# Patient Record
Sex: Female | Born: 1980 | Race: White | Hispanic: No | Marital: Married | Smoking: Never smoker
Health system: Southern US, Community
[De-identification: ages and names within clinical notes are randomized; demographics above are authoritative.]

## PROBLEM LIST (undated history)

## (undated) ENCOUNTER — Inpatient Hospital Stay (HOSPITAL_COMMUNITY): Payer: Self-pay

## (undated) DIAGNOSIS — I10 Essential (primary) hypertension: Secondary | ICD-10-CM

---
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
## 2000-09-05 ENCOUNTER — Other Ambulatory Visit: Admission: RE | Admit: 2000-09-05 | Discharge: 2000-09-05 | Payer: Self-pay | Admitting: *Deleted

## 2001-06-22 ENCOUNTER — Other Ambulatory Visit: Admission: RE | Admit: 2001-06-22 | Discharge: 2001-06-22 | Payer: Self-pay | Admitting: *Deleted

## 2001-09-06 ENCOUNTER — Other Ambulatory Visit: Admission: RE | Admit: 2001-09-06 | Discharge: 2001-09-06 | Payer: Self-pay | Admitting: *Deleted

## 2001-11-23 ENCOUNTER — Emergency Department (HOSPITAL_COMMUNITY): Admission: EM | Admit: 2001-11-23 | Discharge: 2001-11-24 | Payer: Self-pay | Admitting: Emergency Medicine

## 2011-03-11 ENCOUNTER — Other Ambulatory Visit: Payer: Self-pay | Admitting: Family Medicine

## 2011-03-11 DIAGNOSIS — N6459 Other signs and symptoms in breast: Secondary | ICD-10-CM

## 2011-03-16 ENCOUNTER — Ambulatory Visit
Admission: RE | Admit: 2011-03-16 | Discharge: 2011-03-16 | Disposition: A | Discharge status: Home / Self Care | Payer: Self-pay | Source: Ambulatory Visit | Attending: Family Medicine | Admitting: Family Medicine

## 2011-03-16 DIAGNOSIS — N6459 Other signs and symptoms in breast: Secondary | ICD-10-CM

## 2015-08-10 ENCOUNTER — Other Ambulatory Visit (HOSPITAL_COMMUNITY): Payer: Self-pay | Admitting: Obstetrics & Gynecology

## 2015-08-10 DIAGNOSIS — O30032 Twin pregnancy, monochorionic/diamniotic, second trimester: Secondary | ICD-10-CM

## 2015-08-10 DIAGNOSIS — O09522 Supervision of elderly multigravida, second trimester: Secondary | ICD-10-CM

## 2015-08-10 DIAGNOSIS — Z3A16 16 weeks gestation of pregnancy: Secondary | ICD-10-CM

## 2015-08-10 DIAGNOSIS — O34219 Maternal care for unspecified type scar from previous cesarean delivery: Secondary | ICD-10-CM

## 2015-08-10 DIAGNOSIS — O139 Gestational [pregnancy-induced] hypertension without significant proteinuria, unspecified trimester: Secondary | ICD-10-CM

## 2015-08-10 DIAGNOSIS — IMO0001 Reserved for inherently not codable concepts without codable children: Secondary | ICD-10-CM

## 2015-08-24 ENCOUNTER — Other Ambulatory Visit (HOSPITAL_COMMUNITY): Payer: Self-pay | Admitting: Obstetrics & Gynecology

## 2015-08-24 ENCOUNTER — Ambulatory Visit (HOSPITAL_COMMUNITY)
Admission: RE | Admit: 2015-08-24 | Discharge: 2015-08-24 | Disposition: A | Discharge status: Home / Self Care | Payer: BLUE CROSS/BLUE SHIELD | Source: Ambulatory Visit | Attending: Obstetrics & Gynecology | Admitting: Obstetrics & Gynecology

## 2015-08-24 ENCOUNTER — Encounter (HOSPITAL_COMMUNITY): Payer: Self-pay | Admitting: Obstetrics & Gynecology

## 2015-08-24 ENCOUNTER — Encounter (HOSPITAL_COMMUNITY): Payer: Self-pay

## 2015-08-24 DIAGNOSIS — Z3A16 16 weeks gestation of pregnancy: Secondary | ICD-10-CM | POA: Insufficient documentation

## 2015-08-24 DIAGNOSIS — O30032 Twin pregnancy, monochorionic/diamniotic, second trimester: Secondary | ICD-10-CM

## 2015-09-08 ENCOUNTER — Encounter (HOSPITAL_COMMUNITY): Payer: Self-pay

## 2015-09-08 ENCOUNTER — Ambulatory Visit (HOSPITAL_COMMUNITY)
Admission: RE | Admit: 2015-09-08 | Discharge: 2015-09-08 | Disposition: A | Discharge status: Home / Self Care | Payer: BLUE CROSS/BLUE SHIELD | Source: Ambulatory Visit | Attending: Obstetrics & Gynecology | Admitting: Obstetrics & Gynecology

## 2015-09-08 DIAGNOSIS — O30032 Twin pregnancy, monochorionic/diamniotic, second trimester: Secondary | ICD-10-CM | POA: Diagnosis present

## 2015-09-15 ENCOUNTER — Other Ambulatory Visit (HOSPITAL_COMMUNITY): Payer: Self-pay | Admitting: Obstetrics & Gynecology

## 2015-09-15 DIAGNOSIS — Z3A27 27 weeks gestation of pregnancy: Secondary | ICD-10-CM

## 2015-09-15 DIAGNOSIS — O30032 Twin pregnancy, monochorionic/diamniotic, second trimester: Secondary | ICD-10-CM

## 2015-09-15 DIAGNOSIS — Z3A25 25 weeks gestation of pregnancy: Secondary | ICD-10-CM

## 2015-09-15 DIAGNOSIS — Z3A33 33 weeks gestation of pregnancy: Secondary | ICD-10-CM

## 2015-09-15 DIAGNOSIS — Z3A31 31 weeks gestation of pregnancy: Secondary | ICD-10-CM

## 2015-09-15 DIAGNOSIS — Z3A29 29 weeks gestation of pregnancy: Secondary | ICD-10-CM

## 2015-09-15 DIAGNOSIS — Z3A35 35 weeks gestation of pregnancy: Secondary | ICD-10-CM

## 2015-09-18 ENCOUNTER — Ambulatory Visit (INDEPENDENT_AMBULATORY_CARE_PROVIDER_SITE_OTHER): Payer: BLUE CROSS/BLUE SHIELD | Admitting: Cardiology

## 2015-09-18 ENCOUNTER — Ambulatory Visit (HOSPITAL_COMMUNITY)
Admission: RE | Admit: 2015-09-18 | Discharge: 2015-09-18 | Disposition: A | Discharge status: Home / Self Care | Payer: BLUE CROSS/BLUE SHIELD | Source: Ambulatory Visit | Attending: Obstetrics & Gynecology | Admitting: Obstetrics & Gynecology

## 2015-09-18 ENCOUNTER — Encounter (HOSPITAL_COMMUNITY): Payer: Self-pay

## 2015-09-18 ENCOUNTER — Encounter: Payer: Self-pay | Admitting: Cardiology

## 2015-09-18 VITALS — BP 110/68 | HR 105 | Ht 68.0 in | Wt 215.0 lb

## 2015-09-18 DIAGNOSIS — R000 Tachycardia, unspecified: Secondary | ICD-10-CM | POA: Diagnosis not present

## 2015-09-18 DIAGNOSIS — R0600 Dyspnea, unspecified: Secondary | ICD-10-CM

## 2015-09-18 DIAGNOSIS — R002 Palpitations: Secondary | ICD-10-CM | POA: Diagnosis not present

## 2015-09-18 DIAGNOSIS — R0609 Other forms of dyspnea: Secondary | ICD-10-CM

## 2015-09-18 DIAGNOSIS — I471 Supraventricular tachycardia: Secondary | ICD-10-CM

## 2015-09-18 DIAGNOSIS — O30032 Twin pregnancy, monochorionic/diamniotic, second trimester: Secondary | ICD-10-CM | POA: Diagnosis not present

## 2015-09-18 LAB — BASIC METABOLIC PANEL
BUN: 6 mg/dL — ABNORMAL LOW (ref 7–25)
CO2: 21 mmol/L (ref 20–31)
Calcium: 8.9 mg/dL (ref 8.6–10.2)
Chloride: 105 mmol/L (ref 98–110)
Creat: 0.48 mg/dL — ABNORMAL LOW (ref 0.50–1.10)
Glucose, Bld: 76 mg/dL (ref 65–99)
Potassium: 4.1 mmol/L (ref 3.5–5.3)
Sodium: 137 mmol/L (ref 135–146)

## 2015-09-18 LAB — TSH: TSH: 1.541 u[IU]/mL (ref 0.350–4.500)

## 2015-09-18 NOTE — Progress Notes (Signed)
Patient ID: Jessica Wilkins, female   DOB: [birth year], 34 y.o.   MRN: [id number]      Cardiology Office Note  Date:  09/19/2015   ID:  Jessica Wilkins, DOB [birth year], MRN [id number]  PCP:  No PCP Per Patient  Cardiologist:  Lars Masson, MD   History of Present Illness: Jessica Wilkins is a 34 y.o. female who presents for evaluation of tachycardia and palpitations during pregnancy, She is currently pregnant with twins (21. Weeks), due date in February 2017, she has one prior child. No problem during the first pregnancy. During this one she started to experience profound fatigue, when her HR is checked she is frequently tachycardic. On three occassions now she wakes up with tachycardia. She had those episodes prior to the pregnancy, but now they are worse.  Her labs show normal Hb, no TSH checked.  NO syncope, orthopnea, PND, no LE edema.   Past Medical History  Diagnosis Date  . Medical history non-contributory    Past Surgical History  Procedure Laterality Date  . Cesarean section      Current Outpatient Prescriptions  Medication Sig Dispense Refill  . aspirin 81 MG tablet Take 81 mg by mouth daily.    . calcium carbonate (TUMS - DOSED IN MG ELEMENTAL CALCIUM) 500 MG chewable tablet Chew 1 tablet by mouth daily.    . Prenatal Vit-Fe Fumarate-FA (PRENATAL VITAMIN PO) Take 1 tablet by mouth daily.     Marland Kitchen DICLEGIS 10-10 MG TBEC TK 2 TS PO HS  2   No current facility-administered medications for this visit.   Allergies:   Amoxicillin and Penicillins   Social History:  The patient  reports that she has never smoked. She does not have any smokeless tobacco history on file. She reports that she does not drink alcohol or use illicit drugs.   Family History:  The patient's family history includes Hypertension in her paternal grandmother; Stroke in her paternal grandmother. There is no history of Heart attack.   ROS:  Please see the history of present illness.  ll other  systems are reviewed and negative.   PHYSICAL EXAM: VS:  BP 110/68 mmHg  Pulse 105  Ht  (1.727 m)  Wt 215 lb (97.523 kg)  BMI 32.70 kg/m2 , BMI Body mass index is 32.7 kg/(m^2). GEN: Well nourished, well developed, in no acute distress, [redacted] weeks pregnant HEENT: normal Neck: no JVD, carotid bruits, or masses Cardiac: RRR; no murmurs, rubs, or gallops,no edema  Respiratory:  clear to auscultation bilaterally, normal work of breathing GI: soft, nontender, nondistended, + BS MS: no deformity or atrophy Skin: warm and dry, no rash Neuro:  Strength and sensation are intact Psych: euthymic mood, full affect  EKG:  Sinus tachycardia, otherwise normal ECG.   Recent Labs: 09/18/2015: BUN 6*; Creat 0.48*; Potassium 4.1; Sodium 137; TSH 1.541   Lipid Panel No results found for: CHOL, TRIG, HDL, CHOLHDL, VLDL, LDLCALC, LDLDIRECT   Wt Readings from Last 3 Encounters:  09/18/15 216 lb 9.6 oz (98.249 kg)  09/18/15 215 lb (97.523 kg)  09/08/15 211 lb 6.4 oz (95.89 kg)     ASSESSMENT AND PLAN:  1.  Palpitations, possible SVT - start 24 hour Holter monitor  2. Persistent sinus tachycardia - 24 hour Holter, if predominant part of the day, we will consider labetalol, while increase in HR is normal in pregnancy, persistent ST might leads to heart failure.  3. DOE, fatigue - we will check echocardiogram  Labs/ tests ordered today include: Orders Placed This Encounter  Procedures  . TSH  . Basic metabolic panel  . Holter monitor - 24 hour  . EKG 12-Lead  . ECHOCARDIOGRAM COMPLETE   Follow up in 6 weeks.   Signed, Lars Masson, MD  09/19/2015 7:01 AM    [facility] [address] [place], [place], [place]  [address] Phone: [phone]; Fax: [phone]

## 2015-09-18 NOTE — Patient Instructions (Addendum)
Medication Instructions:  Your physician recommends that you continue on your current medications as directed. Please refer to the Current Medication list given to you today.   Labwork: Your physician recommends that you return for lab work in:  TODAY  BMET  AND  TSH   Testing/Procedures: Your physician has requested that you have an echocardiogram. Echocardiography is a painless test that uses sound waves to create images of your heart. It provides your doctor with information about the size and shape of your heart and how well your heart's chambers and valves are working. This procedure takes approximately one hour. There are no restrictions for this procedure.   Your physician has recommended that you wear a holter monitor. Holter monitors are medical devices that record the heart's electrical activity. Doctors most often use these monitors to diagnose arrhythmias. Arrhythmias are problems with the speed or rhythm of the heartbeat. The monitor is a small, portable device. You can wear one while you do your normal daily activities. This is usually used to diagnose what is causing palpitations/syncope (passing out).  24 HOUR  Follow-Up: Your physician recommends that you schedule a follow-up appointment in: 6 WEEKS WITH DR Delton See  Any Other Special Instructions Will Be Listed Below (If Applicable).

## 2015-09-23 ENCOUNTER — Ambulatory Visit (HOSPITAL_COMMUNITY): Payer: BLUE CROSS/BLUE SHIELD

## 2015-09-28 ENCOUNTER — Ambulatory Visit (HOSPITAL_COMMUNITY): Payer: BLUE CROSS/BLUE SHIELD | Attending: Cardiovascular Disease

## 2015-09-28 ENCOUNTER — Ambulatory Visit (INDEPENDENT_AMBULATORY_CARE_PROVIDER_SITE_OTHER): Payer: BLUE CROSS/BLUE SHIELD

## 2015-09-28 ENCOUNTER — Other Ambulatory Visit: Payer: Self-pay

## 2015-09-28 DIAGNOSIS — R002 Palpitations: Secondary | ICD-10-CM | POA: Diagnosis not present

## 2015-09-28 DIAGNOSIS — Z3A22 22 weeks gestation of pregnancy: Secondary | ICD-10-CM | POA: Diagnosis not present

## 2015-09-30 ENCOUNTER — Ambulatory Visit (HOSPITAL_COMMUNITY): Payer: BLUE CROSS/BLUE SHIELD

## 2015-10-02 ENCOUNTER — Ambulatory Visit (HOSPITAL_COMMUNITY)
Admission: RE | Admit: 2015-10-02 | Discharge: 2015-10-02 | Disposition: A | Discharge status: Home / Self Care | Payer: BLUE CROSS/BLUE SHIELD | Source: Ambulatory Visit | Attending: Obstetrics & Gynecology | Admitting: Obstetrics & Gynecology

## 2015-10-02 ENCOUNTER — Other Ambulatory Visit: Payer: Self-pay | Admitting: *Deleted

## 2015-10-02 DIAGNOSIS — O30032 Twin pregnancy, monochorionic/diamniotic, second trimester: Secondary | ICD-10-CM | POA: Diagnosis not present

## 2015-10-02 MED ORDER — LABETALOL HCL 100 MG PO TABS: 100.0000 mg | ORAL_TABLET | Freq: Two times a day (BID) | ORAL | Status: DC

## 2015-10-07 ENCOUNTER — Telehealth: Payer: Self-pay | Admitting: Cardiology

## 2015-10-07 NOTE — Telephone Encounter (Signed)
Pt calling to report that since she has started taking her labetalol 100 mg bid she feels more tired and she had to leave work today, for she felt as if her BP was dropping.  Pt states she went home and took her BP and HR and recordings show BP- 90/57 HR-95. Pt states she took her morning dose of labetalol, but has not taken her evening dose yet.  Pt states she is at home resting, and is feeling somewhat better, but the labetalol is making her more fatigued.  Pt states she is stable enough to remain at home, and [facility]  Pt states that she does feel her babies kicking frequently, so she's not worried about them.  Advised the pt that given she's stable and doesn't want to [facility] at [facility], I advised her to hold her evening dose of labetalol, push extra PO fluids tonight, rest with her feet propped up on pillows, and call me back in the morning prior to taking her morning dose of labetalol, to report her current BP and HR.  Advised the pt to contact the on-call MD at her OBGYN and inform them of this plan and any other recommendations.  Informed the pt that Dr Delton SeeNelson is currently out of the office at this time, but will be in the hospital tomorrow.  Informed the pt that I will route this message to Dr Delton SeeNelson for further review and recommendation and follow-up with her thereafter.  Advised the pt that if she becomes acutely distressed over the night, she should call 911, or seek immediate medical attention.  Pt verbalized understanding and agrees with this plan.

## 2015-10-07 NOTE — Telephone Encounter (Signed)
Pt c/o medication issue:  1. Name of Medication: Labetalol 100mg   2. How are you currently taking this medication (dosage and times per day)? 1 tablet 2x's a day  3. Are you having a reaction (difficulty breathing--STAT)?No  4. What is your medication issue? Pt feeling really tired and couldn't go to work today. BP has dropped to 90/57

## 2015-10-07 NOTE — Telephone Encounter (Signed)
Notes Recorded by Lorayne BenderAnita E Harding, RN on 10/02/2015 at 3:44 PM Notified of holter monitor results. Will send Rx in for Labetalol 100 mg BID. Advised to get a BP cuff and monitor BP. Advised to call us if becomes light headed, dizzy or if BP very low. Dorothy Sparkvy Byrne Capek,LPN will call her in a week but advised to please call if develops any symptoms. Notes Recorded by Lars MassonKatarina H Nelson, MD on 10/02/2015 at 12:56 PM Please call her today:  I would suggest labetalol 100 mg po BID if she can tolerate it with BP.   Benisha Hadaway, please call her in 1 week to see how she feels. Notes Recorded by Lars MassonKatarina H Nelson, MD on 10/02/2015 at 10:27 AM I would suggest labetalol 100 mg po BID if she can tolerate it with BP. Please tart and call her in 1 week to see how see feels.

## 2015-10-08 MED ORDER — LABETALOL HCL 100 MG PO TABS: 100.0000 mg | ORAL_TABLET | Freq: Every day | ORAL | Status: DC

## 2015-10-08 NOTE — Telephone Encounter (Signed)
Follow Up ° °Pt returned call//  °

## 2015-10-08 NOTE — Telephone Encounter (Signed)
HR 95 is great and ok in pregnancy. Its ok to take labetalol only once daily as HR is generally lower at night.

## 2015-10-08 NOTE — Telephone Encounter (Signed)
Informed the pt that per Dr Delton SeeNelson, her HR is 95 and ok in pregnancy, and she recommends that the pt take labetalol 100 mg po daily, as her HR is generally lower at night.  Advised the pt to continue drinking plenty of fluids.  Advised the pt to take this at the same time every day.  Pt prefers to take this at night, for it makes her tired.  Informed her that would be fine.  Advised the pt to continue monitoring her HR and BP and report any concerns to our office.  Changed this in pts med list. Confirmed the pharmacy of choice with the pt.  Pt verbalized understanding and agrees with this plan.

## 2015-10-08 NOTE — Telephone Encounter (Signed)
Pt calling, as instructed to, to report her current BP and HR after recommendations for her to hold her evening dose of labetalol last night and call me in the morning to report current BP and HR before taking her morning dose of labetalol.  Her current BP and HR this morning are now 105/60 and HR- 95. Pt states she is feeling somewhat better, but still tired.  Pt states she did exactly as advised, and is drinking po fluids aggressively.  Pt states she does have an appt with her OBGYN today at 2 pm, for follow-up. Pt reports that she can feel both of her babies moving appropriately.  Pt has no other cardiac complaints at this time.  She states she feels better only taking her labetalol once a day. Advised the pt to hold her a.m dose of labetalol, for her BP is low normal and HR is stable.  Informed the pt that I will route this message to Dr Delton SeeNelson for further review and recommendation and follow-up with her shortly thereafter.  Advised her to continue drinking fluids.  Pt verbalized understanding and agrees with this plan.

## 2015-10-16 ENCOUNTER — Encounter (HOSPITAL_COMMUNITY): Payer: Self-pay

## 2015-10-16 ENCOUNTER — Ambulatory Visit (HOSPITAL_COMMUNITY)
Admission: RE | Admit: 2015-10-16 | Discharge: 2015-10-16 | Disposition: A | Discharge status: Home / Self Care | Payer: BLUE CROSS/BLUE SHIELD | Source: Ambulatory Visit | Attending: Maternal and Fetal Medicine | Admitting: Maternal and Fetal Medicine

## 2015-10-16 DIAGNOSIS — Z3A24 24 weeks gestation of pregnancy: Secondary | ICD-10-CM | POA: Insufficient documentation

## 2015-10-16 DIAGNOSIS — O09522 Supervision of elderly multigravida, second trimester: Secondary | ICD-10-CM | POA: Diagnosis not present

## 2015-10-16 DIAGNOSIS — O09299 Supervision of pregnancy with other poor reproductive or obstetric history, unspecified trimester: Secondary | ICD-10-CM | POA: Insufficient documentation

## 2015-10-16 DIAGNOSIS — O30032 Twin pregnancy, monochorionic/diamniotic, second trimester: Secondary | ICD-10-CM | POA: Insufficient documentation

## 2015-10-16 DIAGNOSIS — O34219 Maternal care for unspecified type scar from previous cesarean delivery: Secondary | ICD-10-CM | POA: Diagnosis not present

## 2015-10-21 ENCOUNTER — Encounter: Payer: Self-pay | Admitting: Cardiology

## 2015-10-30 ENCOUNTER — Ambulatory Visit (HOSPITAL_COMMUNITY)
Admission: RE | Admit: 2015-10-30 | Discharge: 2015-10-30 | Disposition: A | Discharge status: Home / Self Care | Payer: BLUE CROSS/BLUE SHIELD | Source: Ambulatory Visit | Attending: Obstetrics & Gynecology | Admitting: Obstetrics & Gynecology

## 2015-10-30 ENCOUNTER — Other Ambulatory Visit (HOSPITAL_COMMUNITY): Payer: Self-pay | Admitting: Obstetrics & Gynecology

## 2015-10-30 ENCOUNTER — Encounter (HOSPITAL_COMMUNITY): Payer: Self-pay

## 2015-10-30 DIAGNOSIS — O30032 Twin pregnancy, monochorionic/diamniotic, second trimester: Secondary | ICD-10-CM

## 2015-10-30 DIAGNOSIS — O34219 Maternal care for unspecified type scar from previous cesarean delivery: Secondary | ICD-10-CM | POA: Insufficient documentation

## 2015-10-30 DIAGNOSIS — Z3A25 25 weeks gestation of pregnancy: Secondary | ICD-10-CM

## 2015-10-30 DIAGNOSIS — Z3A35 35 weeks gestation of pregnancy: Secondary | ICD-10-CM

## 2015-10-30 DIAGNOSIS — Z3A26 26 weeks gestation of pregnancy: Secondary | ICD-10-CM

## 2015-10-30 DIAGNOSIS — O09899 Supervision of other high risk pregnancies, unspecified trimester: Secondary | ICD-10-CM

## 2015-10-30 DIAGNOSIS — O09292 Supervision of pregnancy with other poor reproductive or obstetric history, second trimester: Secondary | ICD-10-CM | POA: Insufficient documentation

## 2015-10-30 DIAGNOSIS — O09522 Supervision of elderly multigravida, second trimester: Secondary | ICD-10-CM | POA: Diagnosis not present

## 2015-11-12 ENCOUNTER — Ambulatory Visit (HOSPITAL_COMMUNITY)
Admission: RE | Admit: 2015-11-12 | Discharge: 2015-11-12 | Disposition: A | Discharge status: Home / Self Care | Payer: BLUE CROSS/BLUE SHIELD | Source: Ambulatory Visit | Attending: Obstetrics & Gynecology | Admitting: Obstetrics & Gynecology

## 2015-11-12 ENCOUNTER — Encounter (HOSPITAL_COMMUNITY): Payer: Self-pay

## 2015-11-12 DIAGNOSIS — O34219 Maternal care for unspecified type scar from previous cesarean delivery: Secondary | ICD-10-CM | POA: Insufficient documentation

## 2015-11-12 DIAGNOSIS — O30032 Twin pregnancy, monochorionic/diamniotic, second trimester: Secondary | ICD-10-CM | POA: Insufficient documentation

## 2015-11-12 DIAGNOSIS — O09522 Supervision of elderly multigravida, second trimester: Secondary | ICD-10-CM | POA: Insufficient documentation

## 2015-11-12 DIAGNOSIS — Z3A33 33 weeks gestation of pregnancy: Secondary | ICD-10-CM

## 2015-11-12 DIAGNOSIS — O09292 Supervision of pregnancy with other poor reproductive or obstetric history, second trimester: Secondary | ICD-10-CM | POA: Diagnosis not present

## 2015-11-12 DIAGNOSIS — Z3A27 27 weeks gestation of pregnancy: Secondary | ICD-10-CM | POA: Diagnosis not present

## 2015-11-26 ENCOUNTER — Ambulatory Visit: Payer: BLUE CROSS/BLUE SHIELD | Admitting: Cardiology

## 2015-11-26 ENCOUNTER — Ambulatory Visit (HOSPITAL_COMMUNITY)
Admission: RE | Admit: 2015-11-26 | Discharge: 2015-11-26 | Disposition: A | Discharge status: Home / Self Care | Payer: BLUE CROSS/BLUE SHIELD | Source: Ambulatory Visit | Attending: Obstetrics & Gynecology | Admitting: Obstetrics & Gynecology

## 2015-11-26 ENCOUNTER — Other Ambulatory Visit (HOSPITAL_COMMUNITY): Payer: Self-pay | Admitting: Obstetrics & Gynecology

## 2015-11-26 DIAGNOSIS — O09293 Supervision of pregnancy with other poor reproductive or obstetric history, third trimester: Secondary | ICD-10-CM | POA: Insufficient documentation

## 2015-11-26 DIAGNOSIS — Z3A29 29 weeks gestation of pregnancy: Secondary | ICD-10-CM

## 2015-11-26 DIAGNOSIS — Z8759 Personal history of other complications of pregnancy, childbirth and the puerperium: Secondary | ICD-10-CM

## 2015-11-26 DIAGNOSIS — O34219 Maternal care for unspecified type scar from previous cesarean delivery: Secondary | ICD-10-CM | POA: Insufficient documentation

## 2015-11-26 DIAGNOSIS — O99213 Obesity complicating pregnancy, third trimester: Secondary | ICD-10-CM | POA: Diagnosis not present

## 2015-11-26 DIAGNOSIS — O30032 Twin pregnancy, monochorionic/diamniotic, second trimester: Secondary | ICD-10-CM

## 2015-11-26 DIAGNOSIS — Z3A30 30 weeks gestation of pregnancy: Secondary | ICD-10-CM | POA: Diagnosis not present

## 2015-11-26 DIAGNOSIS — O09523 Supervision of elderly multigravida, third trimester: Secondary | ICD-10-CM | POA: Insufficient documentation

## 2015-11-26 DIAGNOSIS — O30033 Twin pregnancy, monochorionic/diamniotic, third trimester: Secondary | ICD-10-CM | POA: Diagnosis not present

## 2015-12-01 ENCOUNTER — Encounter: Payer: Self-pay | Admitting: Cardiology

## 2015-12-01 ENCOUNTER — Ambulatory Visit (INDEPENDENT_AMBULATORY_CARE_PROVIDER_SITE_OTHER): Payer: BLUE CROSS/BLUE SHIELD | Admitting: Cardiology

## 2015-12-01 VITALS — BP 124/80 | HR 106 | Ht 68.0 in | Wt 236.0 lb

## 2015-12-01 DIAGNOSIS — R000 Tachycardia, unspecified: Secondary | ICD-10-CM

## 2015-12-01 DIAGNOSIS — R0609 Other forms of dyspnea: Secondary | ICD-10-CM

## 2015-12-01 DIAGNOSIS — R002 Palpitations: Secondary | ICD-10-CM

## 2015-12-01 DIAGNOSIS — R0600 Dyspnea, unspecified: Secondary | ICD-10-CM

## 2015-12-01 NOTE — Progress Notes (Signed)
Patient ID: Jessica Wilkins, female   DOB: 1981-02-04, 34 y.o.   MRN: [id number]      Cardiology Office Note  Date:  12/01/2015   ID:  Jessica FuchsLauren L Wilkins, DOB 1981-02-04, MRN [id number]  PCP:  No PCP Per Patient  Cardiologist:  Lars MassonNELSON, Cordai Rodrigue H, MD   History of Present Illness: Jessica Wilkins is a 34 y.o. female who presents for evaluation of tachycardia and palpitations during pregnancy, She is currently pregnant with twins (31. Weeks), due date in February 2017, she has one prior child. No problem during the first pregnancy. During this one she started to experience profound fatigue, when her HR is checked she is frequently tachycardic. On three occassions now she wakes up with tachycardia. She had those episodes prior to the pregnancy, but now they are worse.  Her labs show normal Hb, no TSH checked.  NO syncope, orthopnea, PND, no LE edema.   She is coming after 2 months, she was started on labetalol for persistent sinus tachycardia on Holter monitor, her palpitations have significantly improved, she denies LE edema, orthopnea, PND, chest pain, syncope.  Past Medical History  Diagnosis Date  . Medical history non-contributory    Past Surgical History  Procedure Laterality Date  . Cesarean section      Current Outpatient Prescriptions  Medication Sig Dispense Refill  . ranitidine (ZANTAC) 150 MG tablet Take 1 tablet by mouth 2 (two) times daily.  4  . aspirin 81 MG tablet Take 81 mg by mouth daily.    . calcium carbonate (TUMS - DOSED IN MG ELEMENTAL CALCIUM) 500 MG chewable tablet Chew 1 tablet by mouth daily.    Marland Kitchen. DICLEGIS 10-10 MG TBEC TK 2 TS PO HS  2  . labetalol (NORMODYNE) 100 MG tablet Take 1 tablet (100 mg total) by mouth daily. 90 tablet 3  . Prenatal Vit-Fe Fumarate-FA (PRENATAL VITAMIN PO) Take 1 tablet by mouth daily.      No current facility-administered medications for this visit.   Allergies:   Amoxicillin and Penicillins   Social History:  The patient   reports that she has never smoked. She does not have any smokeless tobacco history on file. She reports that she does not drink alcohol or use illicit drugs.   Family History:  The patient's family history includes Hypertension in her paternal grandmother; Stroke in her paternal grandmother. There is no history of Heart attack.   ROS:  Please see the history of present illness.  ll other systems are reviewed and negative.   PHYSICAL EXAM: VS:  BP 124/80 mmHg  Pulse 106  Ht 5\' 8"  (1.727 m)  Wt 236 lb (107.049 kg)  BMI 35.89 kg/m2  SpO2 98% , BMI Body mass index is 35.89 kg/(m^2). GEN: Well nourished, well developed, in no acute distress, [redacted] weeks pregnant HEENT: normal Neck: no JVD, carotid bruits, or masses Cardiac: RRR; no murmurs, rubs, or gallops,no edema  Respiratory:  clear to auscultation bilaterally, normal work of breathing GI: soft, nontender, nondistended, + BS MS: no deformity or atrophy Skin: warm and dry, no rash Neuro:  Strength and sensation are intact Psych: euthymic mood, full affect  EKG:  Sinus tachycardia, otherwise normal ECG.   Recent Labs: 09/18/2015: BUN 6*; Creat 0.48*; Potassium 4.1; Sodium 137; TSH 1.541   Lipid Panel No results found for: CHOL, TRIG, HDL, CHOLHDL, VLDL, LDLCALC, LDLDIRECT   Wt Readings from Last 3 Encounters:  12/01/15 236 lb (107.049 kg)  11/26/15 235 lb 9.6  oz (106.867 kg)  11/12/15 231 lb 3.2 oz (104.872 kg)    TTE: 09/18/2015 Left ventricle: The cavity size was normal. Systolic function was normal. The estimated ejection fraction was in the range of 60% to 65%. Wall motion was normal; there were no regional wall motion abnormalities. - Aortic valve: Transvalvular velocity was within the normal range. There was no stenosis. There was no regurgitation. - Mitral valve: Transvalvular velocity was within the normal range. There was no evidence for stenosis. There was no regurgitation. - Right ventricle: Systolic  function was normal. - Atrial septum: No defect or patent foramen ovale was identified. - Inferior vena cava: The vessel was normal in size. The respirophasic diameter changes were in the normal range (>= 50%), consistent with normal central venous pressure.    ASSESSMENT AND PLAN:  1. Palpitations, possible SVT - no arrhythmias, normal echocardiogram  2. Persistent sinus tachycardia - 24 hour Holter, sinus rhythm to sinus tachycardia for 40 % of the monitoring time. Improved with labetalol 100 mg po at night, she cant tolerate in the morning as her BP is too low. Normal TSH.  3. DOE, fatigue - normal echocardiogram , just related to pregnancy  Follow up in 3 months.  Signed, Lars Masson, MD  12/01/2015 8:53 AM    [facility] [address] [place], [place], [place]  [address] Phone: [phone]; Fax: [phone]

## 2015-12-01 NOTE — Patient Instructions (Signed)
Your physician recommends that you continue on your current medications as directed. Please refer to the Current Medication list given to you today.    Your physician recommends that you schedule a follow-up appointment in: 3 MONTHS WITH DR NELSON  

## 2015-12-10 ENCOUNTER — Other Ambulatory Visit (HOSPITAL_COMMUNITY): Payer: Self-pay | Admitting: Obstetrics & Gynecology

## 2015-12-10 ENCOUNTER — Ambulatory Visit (HOSPITAL_COMMUNITY)
Admission: RE | Admit: 2015-12-10 | Discharge: 2015-12-10 | Disposition: A | Discharge status: Home / Self Care | Payer: BLUE CROSS/BLUE SHIELD | Source: Ambulatory Visit | Attending: Obstetrics & Gynecology | Admitting: Obstetrics & Gynecology

## 2015-12-10 ENCOUNTER — Encounter (HOSPITAL_COMMUNITY): Payer: Self-pay

## 2015-12-10 DIAGNOSIS — O99213 Obesity complicating pregnancy, third trimester: Secondary | ICD-10-CM

## 2015-12-10 DIAGNOSIS — O34219 Maternal care for unspecified type scar from previous cesarean delivery: Secondary | ICD-10-CM | POA: Diagnosis not present

## 2015-12-10 DIAGNOSIS — Z3A32 32 weeks gestation of pregnancy: Secondary | ICD-10-CM | POA: Diagnosis not present

## 2015-12-10 DIAGNOSIS — O09523 Supervision of elderly multigravida, third trimester: Secondary | ICD-10-CM

## 2015-12-10 DIAGNOSIS — O09899 Supervision of other high risk pregnancies, unspecified trimester: Secondary | ICD-10-CM

## 2015-12-10 DIAGNOSIS — Z3A29 29 weeks gestation of pregnancy: Secondary | ICD-10-CM

## 2015-12-10 DIAGNOSIS — O30032 Twin pregnancy, monochorionic/diamniotic, second trimester: Secondary | ICD-10-CM

## 2015-12-10 DIAGNOSIS — O30033 Twin pregnancy, monochorionic/diamniotic, third trimester: Secondary | ICD-10-CM | POA: Insufficient documentation

## 2015-12-15 ENCOUNTER — Inpatient Hospital Stay (HOSPITAL_COMMUNITY)
Admission: AD | Admit: 2015-12-15 | Discharge: 2015-12-15 | Disposition: A | Discharge status: Home / Self Care | Payer: BLUE CROSS/BLUE SHIELD | Source: Ambulatory Visit | Attending: Obstetrics and Gynecology | Admitting: Obstetrics and Gynecology

## 2015-12-15 DIAGNOSIS — O30033 Twin pregnancy, monochorionic/diamniotic, third trimester: Secondary | ICD-10-CM | POA: Insufficient documentation

## 2015-12-15 NOTE — MAU Note (Signed)
Urine sent to lab 

## 2015-12-15 NOTE — MAU Note (Signed)
Pt sent from office for NST, unable to get into clinic.  preg with twins

## 2015-12-18 ENCOUNTER — Ambulatory Visit (INDEPENDENT_AMBULATORY_CARE_PROVIDER_SITE_OTHER): Payer: BLUE CROSS/BLUE SHIELD | Admitting: *Deleted

## 2015-12-18 VITALS — BP 110/76 | HR 109

## 2015-12-18 DIAGNOSIS — O30033 Twin pregnancy, monochorionic/diamniotic, third trimester: Secondary | ICD-10-CM | POA: Diagnosis not present

## 2015-12-18 NOTE — Progress Notes (Signed)
Copy of report and NST tracing sent to MD office w/pt today

## 2015-12-22 ENCOUNTER — Ambulatory Visit (INDEPENDENT_AMBULATORY_CARE_PROVIDER_SITE_OTHER): Payer: BLUE CROSS/BLUE SHIELD | Admitting: *Deleted

## 2015-12-22 VITALS — BP 114/73 | HR 98

## 2015-12-22 DIAGNOSIS — O30033 Twin pregnancy, monochorionic/diamniotic, third trimester: Secondary | ICD-10-CM | POA: Diagnosis not present

## 2015-12-22 NOTE — Progress Notes (Signed)
Copy of report and NST tracing sent to Dr. Holland w/pt today.  

## 2015-12-24 ENCOUNTER — Encounter (HOSPITAL_COMMUNITY): Payer: Self-pay

## 2015-12-24 ENCOUNTER — Ambulatory Visit (HOSPITAL_COMMUNITY)
Admission: RE | Admit: 2015-12-24 | Discharge: 2015-12-24 | Disposition: A | Discharge status: Home / Self Care | Payer: BLUE CROSS/BLUE SHIELD | Source: Ambulatory Visit | Attending: Obstetrics and Gynecology | Admitting: Obstetrics and Gynecology

## 2015-12-24 ENCOUNTER — Other Ambulatory Visit (HOSPITAL_COMMUNITY): Payer: Self-pay | Admitting: Obstetrics & Gynecology

## 2015-12-24 ENCOUNTER — Ambulatory Visit (HOSPITAL_COMMUNITY)
Admission: RE | Admit: 2015-12-24 | Discharge: 2015-12-24 | Disposition: A | Discharge status: Home / Self Care | Payer: BLUE CROSS/BLUE SHIELD | Source: Ambulatory Visit | Attending: Family Medicine | Admitting: Family Medicine

## 2015-12-24 DIAGNOSIS — O09523 Supervision of elderly multigravida, third trimester: Secondary | ICD-10-CM

## 2015-12-24 DIAGNOSIS — O34219 Maternal care for unspecified type scar from previous cesarean delivery: Secondary | ICD-10-CM | POA: Diagnosis not present

## 2015-12-24 DIAGNOSIS — O09293 Supervision of pregnancy with other poor reproductive or obstetric history, third trimester: Secondary | ICD-10-CM

## 2015-12-24 DIAGNOSIS — O30033 Twin pregnancy, monochorionic/diamniotic, third trimester: Secondary | ICD-10-CM | POA: Insufficient documentation

## 2015-12-24 DIAGNOSIS — Z3A31 31 weeks gestation of pregnancy: Secondary | ICD-10-CM

## 2015-12-24 DIAGNOSIS — O30032 Twin pregnancy, monochorionic/diamniotic, second trimester: Secondary | ICD-10-CM

## 2015-12-24 DIAGNOSIS — O99213 Obesity complicating pregnancy, third trimester: Secondary | ICD-10-CM

## 2015-12-24 DIAGNOSIS — Z3A34 34 weeks gestation of pregnancy: Secondary | ICD-10-CM | POA: Insufficient documentation

## 2015-12-29 ENCOUNTER — Ambulatory Visit (INDEPENDENT_AMBULATORY_CARE_PROVIDER_SITE_OTHER): Payer: BLUE CROSS/BLUE SHIELD | Admitting: *Deleted

## 2015-12-29 VITALS — BP 125/80 | HR 105

## 2015-12-29 DIAGNOSIS — O30039 Twin pregnancy, monochorionic/diamniotic, unspecified trimester: Secondary | ICD-10-CM | POA: Insufficient documentation

## 2015-12-29 DIAGNOSIS — O30033 Twin pregnancy, monochorionic/diamniotic, third trimester: Secondary | ICD-10-CM | POA: Diagnosis not present

## 2015-12-29 NOTE — Progress Notes (Signed)
Copy of report and NST tracing sent to Dr. Vincente Poli w/pt today.   Pt denies H/A or visual disturbances.

## 2016-01-01 ENCOUNTER — Ambulatory Visit (INDEPENDENT_AMBULATORY_CARE_PROVIDER_SITE_OTHER): Payer: BLUE CROSS/BLUE SHIELD | Admitting: *Deleted

## 2016-01-01 VITALS — BP 124/86 | HR 102

## 2016-01-01 DIAGNOSIS — O30033 Twin pregnancy, monochorionic/diamniotic, third trimester: Secondary | ICD-10-CM | POA: Diagnosis not present

## 2016-01-04 NOTE — Progress Notes (Signed)
Copy of report and NST tracing sent to Dr. Langston Masker w/pt

## 2016-01-05 ENCOUNTER — Ambulatory Visit (INDEPENDENT_AMBULATORY_CARE_PROVIDER_SITE_OTHER): Payer: BLUE CROSS/BLUE SHIELD | Admitting: *Deleted

## 2016-01-05 VITALS — BP 134/89 | HR 91

## 2016-01-05 DIAGNOSIS — O30033 Twin pregnancy, monochorionic/diamniotic, third trimester: Secondary | ICD-10-CM | POA: Diagnosis not present

## 2016-01-05 NOTE — Progress Notes (Signed)
Pt reports decreased FM x3 days.  Copy of report and NST tracing sent to Dr. Rana Snare w/pt today.

## 2016-01-07 ENCOUNTER — Encounter (HOSPITAL_COMMUNITY)
Admission: AD | Disposition: A | Discharge status: Home / Self Care | Payer: Self-pay | Source: Ambulatory Visit | Attending: Obstetrics and Gynecology

## 2016-01-07 ENCOUNTER — Inpatient Hospital Stay (HOSPITAL_COMMUNITY): Payer: BLUE CROSS/BLUE SHIELD | Admitting: Anesthesiology

## 2016-01-07 ENCOUNTER — Inpatient Hospital Stay (HOSPITAL_COMMUNITY)
Admission: AD | Admit: 2016-01-07 | Discharge: 2016-01-11 | DRG: 765 | Disposition: A | Discharge status: Home / Self Care | Payer: BLUE CROSS/BLUE SHIELD | Source: Ambulatory Visit | Attending: Obstetrics and Gynecology | Admitting: Obstetrics and Gynecology

## 2016-01-07 ENCOUNTER — Encounter (HOSPITAL_COMMUNITY): Payer: Self-pay | Admitting: *Deleted

## 2016-01-07 ENCOUNTER — Ambulatory Visit (HOSPITAL_COMMUNITY): Payer: BLUE CROSS/BLUE SHIELD

## 2016-01-07 DIAGNOSIS — Z3A35 35 weeks gestation of pregnancy: Secondary | ICD-10-CM

## 2016-01-07 DIAGNOSIS — K219 Gastro-esophageal reflux disease without esophagitis: Secondary | ICD-10-CM | POA: Diagnosis present

## 2016-01-07 DIAGNOSIS — Z823 Family history of stroke: Secondary | ICD-10-CM | POA: Diagnosis not present

## 2016-01-07 DIAGNOSIS — Z302 Encounter for sterilization: Secondary | ICD-10-CM | POA: Diagnosis not present

## 2016-01-07 DIAGNOSIS — Z9851 Tubal ligation status: Secondary | ICD-10-CM

## 2016-01-07 DIAGNOSIS — O9962 Diseases of the digestive system complicating childbirth: Secondary | ICD-10-CM | POA: Diagnosis present

## 2016-01-07 DIAGNOSIS — O1414 Severe pre-eclampsia complicating childbirth: Principal | ICD-10-CM | POA: Diagnosis present

## 2016-01-07 DIAGNOSIS — R030 Elevated blood-pressure reading, without diagnosis of hypertension: Secondary | ICD-10-CM | POA: Diagnosis present

## 2016-01-07 DIAGNOSIS — Z8249 Family history of ischemic heart disease and other diseases of the circulatory system: Secondary | ICD-10-CM

## 2016-01-07 DIAGNOSIS — O30033 Twin pregnancy, monochorionic/diamniotic, third trimester: Secondary | ICD-10-CM | POA: Diagnosis present

## 2016-01-07 HISTORY — DX: Essential (primary) hypertension: I10

## 2016-01-07 LAB — COMPREHENSIVE METABOLIC PANEL
ALT: 49 U/L (ref 14–54)
ANION GAP: 7 (ref 5–15)
AST: 58 U/L — ABNORMAL HIGH (ref 15–41)
Albumin: 2.7 g/dL — ABNORMAL LOW (ref 3.5–5.0)
Alkaline Phosphatase: 155 U/L — ABNORMAL HIGH (ref 38–126)
BUN: 8 mg/dL (ref 6–20)
CHLORIDE: 105 mmol/L (ref 101–111)
CO2: 23 mmol/L (ref 22–32)
CREATININE: 0.68 mg/dL (ref 0.44–1.00)
Calcium: 8.9 mg/dL (ref 8.9–10.3)
Glucose, Bld: 87 mg/dL (ref 65–99)
POTASSIUM: 4.7 mmol/L (ref 3.5–5.1)
SODIUM: 135 mmol/L (ref 135–145)
Total Bilirubin: 0.6 mg/dL (ref 0.3–1.2)
Total Protein: 5.9 g/dL — ABNORMAL LOW (ref 6.5–8.1)

## 2016-01-07 LAB — URINE MICROSCOPIC-ADD ON: Bacteria, UA: NONE SEEN

## 2016-01-07 LAB — CBC
HCT: 35.4 % — ABNORMAL LOW (ref 36.0–46.0)
Hemoglobin: 12 g/dL (ref 12.0–15.0)
MCH: 31.3 pg (ref 26.0–34.0)
MCHC: 33.9 g/dL (ref 30.0–36.0)
MCV: 92.4 fL (ref 78.0–100.0)
PLATELETS: 145 10*3/uL — AB (ref 150–400)
RBC: 3.83 MIL/uL — ABNORMAL LOW (ref 3.87–5.11)
RDW: 12.9 % (ref 11.5–15.5)
WBC: 7.7 10*3/uL (ref 4.0–10.5)

## 2016-01-07 LAB — URINALYSIS, ROUTINE W REFLEX MICROSCOPIC
BILIRUBIN URINE: NEGATIVE
Glucose, UA: NEGATIVE mg/dL
KETONES UR: NEGATIVE mg/dL
NITRITE: NEGATIVE
PROTEIN: 30 mg/dL — AB
Specific Gravity, Urine: 1.01 (ref 1.005–1.030)
pH: 6 (ref 5.0–8.0)

## 2016-01-07 LAB — PROTEIN / CREATININE RATIO, URINE
CREATININE, URINE: 23 mg/dL
Protein Creatinine Ratio: 1.96 mg/mg{Cre} — ABNORMAL HIGH (ref 0.00–0.15)
TOTAL PROTEIN, URINE: 45 mg/dL

## 2016-01-07 LAB — TYPE AND SCREEN
ABO/RH(D): O POS
Antibody Screen: NEGATIVE

## 2016-01-07 LAB — LACTATE DEHYDROGENASE: LDH: 228 U/L — AB (ref 98–192)

## 2016-01-07 LAB — URIC ACID: URIC ACID, SERUM: 6.7 mg/dL — AB (ref 2.3–6.6)

## 2016-01-07 SURGERY — Surgical Case
Anesthesia: Spinal

## 2016-01-07 MED ORDER — OXYTOCIN 10 UNIT/ML IJ SOLN
INTRAMUSCULAR | Status: AC
  Filled 2016-01-07: qty 4

## 2016-01-07 MED ORDER — KETOROLAC TROMETHAMINE 30 MG/ML IJ SOLN: 30.0000 mg | Freq: Four times a day (QID) | INTRAMUSCULAR | Status: AC | PRN

## 2016-01-07 MED ORDER — PROMETHAZINE HCL 25 MG/ML IJ SOLN: 6.2500 mg | INTRAMUSCULAR | Status: DC | PRN

## 2016-01-07 MED ORDER — PHENYLEPHRINE 8 MG IN D5W 100 ML (0.08MG/ML) PREMIX OPTIME
INJECTION | INTRAVENOUS | Status: AC
  Filled 2016-01-07: qty 100

## 2016-01-07 MED ORDER — NALOXONE HCL 0.4 MG/ML IJ SOLN: 0.4000 mg | INTRAMUSCULAR | Status: DC | PRN

## 2016-01-07 MED ORDER — DEXAMETHASONE SODIUM PHOSPHATE 10 MG/ML IJ SOLN
INTRAMUSCULAR | Status: AC
  Filled 2016-01-07: qty 1

## 2016-01-07 MED ORDER — LACTATED RINGERS IV BOLUS (SEPSIS): 1000.0000 mL | Freq: Once | INTRAVENOUS | Status: DC

## 2016-01-07 MED ORDER — FENTANYL CITRATE (PF) 100 MCG/2ML IJ SOLN
INTRAMUSCULAR | Status: DC | PRN
  Administered 2016-01-07: 20 ug via INTRATHECAL

## 2016-01-07 MED ORDER — MEPERIDINE HCL 25 MG/ML IJ SOLN: 6.2500 mg | INTRAMUSCULAR | Status: DC | PRN

## 2016-01-07 MED ORDER — NALBUPHINE HCL 10 MG/ML IJ SOLN: 5.0000 mg | Freq: Once | INTRAMUSCULAR | Status: DC | PRN

## 2016-01-07 MED ORDER — DIPHENHYDRAMINE HCL 50 MG/ML IJ SOLN: 12.5000 mg | INTRAMUSCULAR | Status: DC | PRN

## 2016-01-07 MED ORDER — MORPHINE SULFATE (PF) 0.5 MG/ML IJ SOLN
INTRAMUSCULAR | Status: AC
  Filled 2016-01-07: qty 10

## 2016-01-07 MED ORDER — PHENYLEPHRINE HCL 10 MG/ML IJ SOLN
INTRAMUSCULAR | Status: DC | PRN
Start: 2016-01-07 — End: 2016-01-07
  Administered 2016-01-07: 40 ug via INTRAVENOUS
  Administered 2016-01-07 (×2): 80 ug via INTRAVENOUS

## 2016-01-07 MED ORDER — PHENYLEPHRINE 40 MCG/ML (10ML) SYRINGE FOR IV PUSH (FOR BLOOD PRESSURE SUPPORT)
PREFILLED_SYRINGE | INTRAVENOUS | Status: AC
  Filled 2016-01-07: qty 10

## 2016-01-07 MED ORDER — MAGNESIUM SULFATE BOLUS VIA INFUSION
4.0000 g | Freq: Once | INTRAVENOUS | Status: AC
  Administered 2016-01-07: 4 g via INTRAVENOUS
  Filled 2016-01-07: qty 500

## 2016-01-07 MED ORDER — MORPHINE SULFATE (PF) 0.5 MG/ML IJ SOLN
INTRAMUSCULAR | Status: DC | PRN
  Administered 2016-01-07: .2 mg via INTRATHECAL

## 2016-01-07 MED ORDER — KETOROLAC TROMETHAMINE 30 MG/ML IJ SOLN
30.0000 mg | Freq: Four times a day (QID) | INTRAMUSCULAR | Status: AC | PRN
  Administered 2016-01-07: 30 mg via INTRAMUSCULAR
  Filled 2016-01-07 (×2): qty 1

## 2016-01-07 MED ORDER — GENTAMICIN SULFATE 40 MG/ML IJ SOLN: 5.0000 mg/kg | INTRAMUSCULAR | Status: DC

## 2016-01-07 MED ORDER — DIPHENHYDRAMINE HCL 25 MG PO CAPS: 25.0000 mg | ORAL_CAPSULE | ORAL | Status: DC | PRN

## 2016-01-07 MED ORDER — FENTANYL CITRATE (PF) 100 MCG/2ML IJ SOLN: 25.0000 ug | INTRAMUSCULAR | Status: DC | PRN

## 2016-01-07 MED ORDER — ONDANSETRON HCL 4 MG/2ML IJ SOLN
INTRAMUSCULAR | Status: AC
  Filled 2016-01-07: qty 2

## 2016-01-07 MED ORDER — GENTAMICIN SULFATE 40 MG/ML IJ SOLN
INTRAMUSCULAR | Status: AC
  Administered 2016-01-07: 116.5 mL via INTRAVENOUS
  Filled 2016-01-07: qty 10.5

## 2016-01-07 MED ORDER — NALOXONE HCL 2 MG/2ML IJ SOSY
1.0000 ug/kg/h | PREFILLED_SYRINGE | INTRAVENOUS | Status: DC | PRN
  Filled 2016-01-07: qty 2

## 2016-01-07 MED ORDER — SCOPOLAMINE 1 MG/3DAYS TD PT72
MEDICATED_PATCH | TRANSDERMAL | Status: DC | PRN
  Administered 2016-01-07: 1 via TRANSDERMAL

## 2016-01-07 MED ORDER — CITRIC ACID-SODIUM CITRATE 334-500 MG/5ML PO SOLN
30.0000 mL | Freq: Once | ORAL | Status: AC
  Administered 2016-01-07: 30 mL via ORAL
  Filled 2016-01-07: qty 15

## 2016-01-07 MED ORDER — LACTATED RINGERS IV SOLN
INTRAVENOUS | Status: DC | PRN
  Administered 2016-01-07 (×2): via INTRAVENOUS

## 2016-01-07 MED ORDER — MAGNESIUM SULFATE 50 % IJ SOLN
2.0000 g/h | INTRAVENOUS | Status: DC
  Administered 2016-01-07 – 2016-01-08 (×2): 2 g/h via INTRAVENOUS
  Filled 2016-01-07 (×2): qty 80

## 2016-01-07 MED ORDER — FENTANYL CITRATE (PF) 100 MCG/2ML IJ SOLN
INTRAMUSCULAR | Status: AC
  Filled 2016-01-07: qty 2

## 2016-01-07 MED ORDER — GENTAMICIN SULFATE 40 MG/ML IJ SOLN: INTRAMUSCULAR | Status: DC

## 2016-01-07 MED ORDER — SODIUM CHLORIDE 0.9% FLUSH: 3.0000 mL | INTRAVENOUS | Status: DC | PRN

## 2016-01-07 MED ORDER — NALBUPHINE HCL 10 MG/ML IJ SOLN: 5.0000 mg | INTRAMUSCULAR | Status: DC | PRN

## 2016-01-07 MED ORDER — ONDANSETRON HCL 4 MG/2ML IJ SOLN
INTRAMUSCULAR | Status: DC | PRN
  Administered 2016-01-07: 4 mg via INTRAVENOUS

## 2016-01-07 MED ORDER — BUPIVACAINE IN DEXTROSE 0.75-8.25 % IT SOLN
INTRATHECAL | Status: DC | PRN
Start: 2016-01-07 — End: 2016-01-07
  Administered 2016-01-07: 1.6 mL via INTRATHECAL

## 2016-01-07 MED ORDER — SCOPOLAMINE 1 MG/3DAYS TD PT72: 1.0000 | MEDICATED_PATCH | Freq: Once | TRANSDERMAL | Status: DC

## 2016-01-07 MED ORDER — LACTATED RINGERS IV SOLN: INTRAVENOUS | Status: DC

## 2016-01-07 MED ORDER — SODIUM CHLORIDE 0.9 % IR SOLN
Status: DC | PRN
  Administered 2016-01-07: 1000 mL

## 2016-01-07 MED ORDER — LACTATED RINGERS IV SOLN
INTRAVENOUS | Status: DC
  Administered 2016-01-07: 17:00:00 via INTRAVENOUS

## 2016-01-07 MED ORDER — MAGNESIUM SULFATE 4 GM/100ML IV SOLN: 4.0000 g | Freq: Once | INTRAVENOUS | Status: DC

## 2016-01-07 MED ORDER — LABETALOL HCL 5 MG/ML IV SOLN
20.0000 mg | INTRAVENOUS | Status: DC | PRN
  Administered 2016-01-07: 20 mg via INTRAVENOUS
  Filled 2016-01-07: qty 4

## 2016-01-07 MED ORDER — FAMOTIDINE IN NACL 20-0.9 MG/50ML-% IV SOLN
20.0000 mg | Freq: Once | INTRAVENOUS | Status: AC
  Administered 2016-01-07: 20 mg via INTRAVENOUS
  Filled 2016-01-07: qty 50

## 2016-01-07 MED ORDER — SCOPOLAMINE 1 MG/3DAYS TD PT72
MEDICATED_PATCH | TRANSDERMAL | Status: AC
  Filled 2016-01-07: qty 1

## 2016-01-07 MED ORDER — MAGNESIUM SULFATE 50 % IJ SOLN: 2.0000 g/h | INTRAVENOUS | Status: DC

## 2016-01-07 MED ORDER — ONDANSETRON HCL 4 MG/2ML IJ SOLN: 4.0000 mg | Freq: Three times a day (TID) | INTRAMUSCULAR | Status: DC | PRN

## 2016-01-07 MED ORDER — PHENYLEPHRINE 8 MG IN D5W 100 ML (0.08MG/ML) PREMIX OPTIME
INJECTION | INTRAVENOUS | Status: DC | PRN
  Administered 2016-01-07: 30 ug/min via INTRAVENOUS

## 2016-01-07 MED ORDER — LACTATED RINGERS IV SOLN
INTRAVENOUS | Status: DC
  Administered 2016-01-07: 125 mL/h via INTRAVENOUS
  Administered 2016-01-08: 05:00:00 via INTRAVENOUS

## 2016-01-07 MED ORDER — CLINDAMYCIN PHOSPHATE 900 MG/50ML IV SOLN: 900.0000 mg | INTRAVENOUS | Status: DC

## 2016-01-07 SURGICAL SUPPLY — 32 items
CLAMP CORD UMBIL (MISCELLANEOUS) ×6 IMPLANT
CLIP FILSHIE TUBAL LIGA STRL (Clip) ×6 IMPLANT
CLOTH BEACON ORANGE TIMEOUT ST (SAFETY) ×3 IMPLANT
DRAPE SHEET LG 3/4 BI-LAMINATE (DRAPES) ×9 IMPLANT
DRSG OPSITE POSTOP 4X10 (GAUZE/BANDAGES/DRESSINGS) ×3 IMPLANT
DURAPREP 26ML APPLICATOR (WOUND CARE) ×3 IMPLANT
ELECT REM PT RETURN 9FT ADLT (ELECTROSURGICAL) ×3
ELECTRODE REM PT RTRN 9FT ADLT (ELECTROSURGICAL) ×1 IMPLANT
EXTRACTOR VACUUM M CUP 4 TUBE (SUCTIONS) IMPLANT
EXTRACTOR VACUUM M CUP 4' TUBE (SUCTIONS)
GLOVE BIOGEL PI IND STRL 7.0 (GLOVE) ×3 IMPLANT
GLOVE BIOGEL PI INDICATOR 7.0 (GLOVE) ×6
GLOVE SURG ORTHO 8.0 STRL STRW (GLOVE) ×5 IMPLANT
GOWN STRL REUS W/TWL LRG LVL3 (GOWN DISPOSABLE) ×9 IMPLANT
KIT ABG SYR 3ML LUER SLIP (SYRINGE) ×5 IMPLANT
NDL HYPO 25X5/8 SAFETYGLIDE (NEEDLE) ×1 IMPLANT
NEEDLE HYPO 25X5/8 SAFETYGLIDE (NEEDLE) ×6 IMPLANT
NS IRRIG 1000ML POUR BTL (IV SOLUTION) ×3 IMPLANT
PACK C SECTION WH (CUSTOM PROCEDURE TRAY) ×3 IMPLANT
PAD OB MATERNITY 4.3X12.25 (PERSONAL CARE ITEMS) ×3 IMPLANT
PENCIL SMOKE EVAC W/HOLSTER (ELECTROSURGICAL) ×3 IMPLANT
RETRACTOR WND ALEXIS 25 LRG (MISCELLANEOUS) ×1 IMPLANT
RTRCTR WOUND ALEXIS 25CM LRG (MISCELLANEOUS) ×3
SUT MNCRL 0 VIOLET CTX 36 (SUTURE) ×3 IMPLANT
SUT MON AB 4-0 PS1 27 (SUTURE) ×3 IMPLANT
SUT MONOCRYL 0 CTX 36 (SUTURE) ×6
SUT PDS AB 1 CT  36 (SUTURE) ×2
SUT PDS AB 1 CT 36 (SUTURE) IMPLANT
SUT VIC AB 1 CTX 36 (SUTURE) ×3
SUT VIC AB 1 CTX36XBRD ANBCTRL (SUTURE) ×1 IMPLANT
TOWEL OR 17X24 6PK STRL BLUE (TOWEL DISPOSABLE) ×3 IMPLANT
TRAY FOLEY CATH SILVER 14FR (SET/KITS/TRAYS/PACK) ×3 IMPLANT

## 2016-01-07 NOTE — MAU Provider Note (Signed)
History     CSN: [id number]  Arrival date and time: 01/07/16 1444   First Provider Initiated Contact with Patient 01/07/16 1624      Chief Complaint  Patient presents with  . Hypertension   HPI   Jessica Wilkins is a 35 y.o. female G2P1001 at 71w5dwith a history of gestational hypertension presents to MAU for PAkronlabs. The patient was seen by her OB today and had elevated BP readings in the office.  She denies headache in the last 24 hours; in the last week she has had a headache twice which is abnormal for her.  Denies visual changes  + fetal movement Denies vaginal bleeding or leaking of fluid.   Planning a repeat c-section next week.   OB History    Gravida Para Term Preterm AB TAB SAB Ectopic Multiple Living   2 1 1       1       Past Medical History  Diagnosis Date  . Hypertension     Past Surgical History  Procedure Laterality Date  . Cesarean section      Family History  Problem Relation Age of Onset  . Stroke Paternal Grandmother   . Hypertension Paternal Grandmother   . Heart attack Neg Hx     Social History  Substance Use Topics  . Smoking status: Never Smoker   . Smokeless tobacco: None  . Alcohol Use: No    Allergies:  Allergies  Allergen Reactions  . Amoxicillin Hives  . Cephalexin Hives  . Penicillins Hives    Has patient had a PCN reaction causing immediate rash, facial/tongue/throat swelling, SOB or lightheadedness with hypotension: No Has patient had a PCN reaction causing severe rash involving mucus membranes or skin necrosis: No Has patient had a PCN reaction that required hospitalization No Has patient had a PCN reaction occurring within the last 10 years: No If all of the above answers are "NO", then may proceed with Cephalosporin use.     Prescriptions prior to admission  Medication Sig Dispense Refill Last Dose  . acetaminophen (TYLENOL) 325 MG tablet Take 650 mg by mouth every 6 (six) hours as needed for mild pain.    Past Week at Unknown time  . alum & mag hydroxide-simeth (MAALOX/MYLANTA) 200-200-20 MG/5ML suspension Take 10-20 mLs by mouth at bedtime as needed for indigestion or heartburn.   01/06/2016 at Unknown time  . Doxylamine-Pyridoxine (DICLEGIS) 10-10 MG TBEC Take 1 tablet by mouth at bedtime.   01/06/2016 at Unknown time  . labetalol (NORMODYNE) 100 MG tablet Take 1 tablet (100 mg total) by mouth daily. 90 tablet 3 01/06/2016 at 2400  . pantoprazole (PROTONIX) 40 MG tablet Take 1 tablet by mouth daily.  2 01/07/2016 at Unknown time  . Prenatal Vit-Fe Fumarate-FA (PRENATAL MULTIVITAMIN) TABS tablet Take 1 tablet by mouth daily at 12 noon.   01/07/2016 at Unknown time  . ranitidine (ZANTAC) 150 MG tablet Take 1 tablet by mouth daily.   4 01/06/2016 at Unknown time   Results for orders placed or performed during the hospital encounter of 01/07/16 (from the past 48 hour(s))  [facility]inalysis, [facility]     Status: Abnormal   Collection Time: 01/07/16  3:00 PM  Result Value Ref Range   Color, Urine YELLOW YELLOW   APPearance HAZY (A) CLEAR   Specific Gravity, Urine 1.010 1.005 - 1.030   pH 6.0 5.0 - 8.0   Glucose, UA NEGATIVE NEGATIVE mg/dL  Hgb urine dipstick SMALL (A) NEGATIVE   Bilirubin Urine NEGATIVE NEGATIVE   Ketones, ur NEGATIVE NEGATIVE mg/dL   Protein, ur 30 (A) NEGATIVE mg/dL   Nitrite NEGATIVE NEGATIVE   Leukocytes, UA SMALL (A) NEGATIVE  Protein / creatinine ratio, urine     Status: Abnormal   Collection Time: 01/07/16  3:00 PM  Result Value Ref Range   Creatinine, Urine 23.00 mg/dL   Total Protein, Urine 45 mg/dL    Comment: NO NORMAL RANGE ESTABLISHED FOR THIS TEST   Protein Creatinine Ratio 1.96 (H) 0.00 - 0.15 mg/mg[Cre]  Urine microscopic-add on     Status: Abnormal   Collection Time: 01/07/16  3:00 PM  Result Value Ref Range   Squamous Epithelial / LPF 0-5 (A) NONE SEEN   WBC, UA 0-5 0 - 5 WBC/hpf   RBC / HPF 0-5 0 - 5 RBC/hpf   Bacteria, UA NONE  SEEN NONE SEEN  CBC     Status: Abnormal   Collection Time: 01/07/16  4:02 PM  Result Value Ref Range   WBC 7.7 4.0 - 10.5 K/uL   RBC 3.83 (L) 3.87 - 5.11 MIL/uL   Hemoglobin 12.0 12.0 - 15.0 g/dL   HCT 35.4 (L) 36.0 - 46.0 %   MCV 92.4 78.0 - 100.0 fL   MCH 31.3 26.0 - 34.0 pg   MCHC 33.9 30.0 - 36.0 g/dL   RDW 12.9 11.5 - 15.5 %   Platelets 145 (L) 150 - 400 K/uL  Comprehensive metabolic panel     Status: Abnormal   Collection Time: 01/07/16  4:02 PM  Result Value Ref Range   Sodium 135 135 - 145 mmol/L   Potassium 4.7 3.5 - 5.1 mmol/L   Chloride 105 101 - 111 mmol/L   CO2 23 22 - 32 mmol/L   Glucose, Bld 87 65 - 99 mg/dL   BUN 8 6 - 20 mg/dL   Creatinine, Ser 0.68 0.44 - 1.00 mg/dL   Calcium 8.9 8.9 - 10.3 mg/dL   Total Protein 5.9 (L) 6.5 - 8.1 g/dL   Albumin 2.7 (L) 3.5 - 5.0 g/dL   AST 58 (H) 15 - 41 U/L   ALT 49 14 - 54 U/L   Alkaline Phosphatase 155 (H) 38 - 126 U/L   Total Bilirubin 0.6 0.3 - 1.2 mg/dL   GFR calc non Af Amer >60 >60 mL/min   GFR calc Af Amer >60 >60 mL/min    Comment: (NOTE) The eGFR has been calculated using the CKD EPI equation. This calculation has not been validated in all clinical situations. eGFR's persistently <60 mL/min signify possible Chronic Kidney Disease.    Anion gap 7 5 - 15  Uric acid     Status: Abnormal   Collection Time: 01/07/16  4:02 PM  Result Value Ref Range   Uric Acid, Serum 6.7 (H) 2.3 - 6.6 mg/dL  Lactate dehydrogenase     Status: Abnormal   Collection Time: 01/07/16  4:02 PM  Result Value Ref Range   LDH 228 (H) 98 - 192 U/L    Review of Systems  Constitutional: Negative for fever and chills.  Eyes: Negative for blurred vision.  Cardiovascular: Negative for chest pain.  Gastrointestinal: Positive for abdominal pain (? contractions ).  Neurological: Negative for headaches.   Physical Exam   Blood pressure 155/104, pulse 97, temperature 98.6 F (37 C), temperature source Oral, resp. rate 18, height 5' 8"   (1.727 m), weight 251 lb 9.6 oz (114.125 kg).  Filed Vitals:   01/07/16 1645 01/07/16 1700  BP: 149/92 155/104  Pulse: 92 97  Temp:    Resp:     Physical Exam  Constitutional: She is oriented to person, place, and time. She appears well-developed and well-nourished. No distress.  HENT:  Head: Normocephalic.  Eyes: Pupils are equal, round, and reactive to light.  Cardiovascular: Normal rate and normal heart sounds.   Respiratory: Effort normal.  GI: Soft. There is no tenderness.  Musculoskeletal: Normal range of motion.  Neurological: She is alert and oriented to person, place, and time. She has normal reflexes. She displays normal reflexes.  Negative clonus   Skin: Skin is warm. She is not diaphoretic.  Psychiatric: Her behavior is normal.    Fetal Tracing: A Baseline: 130 bpm Variability: moderate  Accelerations: 15x15 Decelerations: None Toco: UI   Fetal Tracing: B Baseline: 145 bpm  Variability: Moderate  Accelerations: 15x15 Decelerations: None Toco: UI   MAU Course  Procedures  None  MDM  PIH labs, CBC, CMP, Uric acid, LDH and protein/creatine ratio.  LR  Labetalol protocol placed Discussed labs, NST, and BP readings with Dr. Corinna Capra   Assessment and Plan   A:  Preeclampsia with severe features  P:  Dr. Corinna Capra at the bedside Plan for repeat C-section today.    Lezlie Lye, NP 01/07/2016 5:06 PM

## 2016-01-07 NOTE — MAU Note (Signed)
Pt here for elevated b/p in office. Denies Headache or viual changes .

## 2016-01-07 NOTE — H&P (Signed)
Jessica Wilkins is a 35 y.o. female presenting for eval of elevated BPs in office.  Hx of PIH last preg.  In MAU, BPs 160s/90-100s.  Elevated Cr ratio 1.9 and elevated LFTs c/w gestational HTN with severe features.  preg complicated by Mono/Di twins and prev c/s for repeat.Marland Kitchen History OB History    Gravida Para Term Preterm AB TAB SAB Ectopic Multiple Living   Past Medical History  Diagnosis Date  . Hypertension    Past Surgical History  Procedure Laterality Date  . Cesarean section     Family History: family history includes Hypertension in her paternal grandmother; Stroke in her paternal grandmother. There is no history of Heart attack. Social History:  reports that she has never smoked. She does not have any smokeless tobacco history on file. She reports that she does not drink alcohol or use illicit drugs.   Prenatal Transfer Tool  Maternal Diabetes: No Genetic Screening: Normal Maternal Ultrasounds/Referrals: Normal Fetal Ultrasounds or other Referrals:  None Maternal Substance Abuse:  No Significant Maternal Medications:  None Significant Maternal Lab Results:  None Other Comments:  None  ROS    Blood pressure 147/100, pulse 93, temperature 98.6 F (37 C), temperature source Oral, resp. rate 18, height  (1.727 m), weight 251 lb 9.6 oz (114.125 kg). Exam Physical Exam   DTRs 3/4 2 beats clonus Abd Gravid nt   Prenatal labs: ABO, Rh:   Antibody:   Rubella:   RPR:    HBsAg:    HIV:    GBS:     Assessment/Plan: Twins at 2 5/7 with gest HTN with severe features (BPs and LFTS).  Discussed mag/BMZ and deliver in 24-48 h versus immediate del.  Pt desires immediate delivery Pt also desires BTL Plan repeat LSTCS/BTL Risks and benefits of C/S were discussed.  All questions were answered and informed consent was obtained.  Plan to proceed with low segment transverse Cesarean Section. Postpartum Mag    Jessica Wilkins C 01/07/2016, 5:16  PM

## 2016-01-07 NOTE — Op Note (Signed)
Cesarean Section Procedure Note  Pre-operative Diagnosis: Vtx/Br Twins at 55 5/7, Gest HTN with severe Features, Prev C/S, Desires sterility  Post-operative Diagnosis: same  Surgeon: Turner Daniels   Assistants: none  Anesthesia: spinal  Procedure:  Low Segment Transverse cesarean section  Procedure Details  The patient was seen in the Holding Room. The risks, benefits, complications, treatment options, and expected outcomes were discussed with the patient.  The patient concurred with the proposed plan, giving informed consent.  The site of surgery properly noted/marked.. A Time Out was held and the above information confirmed.  After induction of anesthesia, the patient was draped and prepped in the usual sterile manner. A Pfannenstiel incision was made and carried down through the subcutaneous tissue to the fascia. Fascial incision was made and extended transversely. The fascia was separated from the underlying rectus tissue superiorly and inferiorly. The peritoneum was identified and entered. Peritoneal incision was extended longitudinally. The utero-vesical peritoneal reflection was incised transversely and the bladder flap was bluntly freed from the lower uterine segment. A low transverse uterine incision was made. Delivered from vertex presentation was a baby with Apgar scores of 7 at one minute and 8 at five minutes. After the umbilical cord was clamped and cut cord blood was obtained for evaluation a cord clamps was placed on baby A's cord.  Baby B was delivered from a frank breech presentation with Apgars of 9,9.  Cord blood obtained.  The placenta was removed intact and appeared normal for monochorionic placenta and sent to pathology. The uterine outline, tubes and ovaries appeared normal. The fallopian tubes were identified by their fimbriated ends and filshie clips were placed across the tubes at the midportion of each tube.  Good placement was noted bilaterally. The uterine incision was  closed with running locked sutures of 0 monocryl and imbricated with 0 monocryl. Hemostasis was observed. Lavage was carried out until clear. The peritoneum was then closed with 0 monocryl and rectus muscles plicated in the midline.  After hemostasis was assured, the fascia was then reapproximated with running sutures of 0 PDS. Irrigation was applied and after adequate hemostasis was assured, the skin was reapproximated with subcutaneous sutures using 4-0 monocryl.  Instrument, sponge, and needle counts were correct prior the abdominal closure and at the conclusion of the case. The patient received Clindamycin and gentamicin preoperatively.  Findings: Viable females  Estimated Blood Loss:  700cc         Specimens: Placenta was sent to Pathology         Complications:  None

## 2016-01-07 NOTE — Anesthesia Postprocedure Evaluation (Signed)
Anesthesia Post Note  Patient: Jessica Wilkins  Procedure(s) Performed: Procedure(s) (LRB): CESAREAN SECTION (N/A)  Patient location during evaluation: PACU Anesthesia Type: Spinal Level of consciousness: oriented and awake and alert Pain management: pain level controlled Vital Signs Assessment: post-procedure vital signs reviewed and stable Respiratory status: spontaneous breathing, respiratory function stable and patient connected to nasal cannula oxygen Cardiovascular status: blood pressure returned to baseline and stable Postop Assessment: no headache, no backache and spinal receding Anesthetic complications: no    Last Vitals:  Filed Vitals:   01/07/16 2130 01/07/16 2145  BP: 125/83 104/79  Pulse: 87 89  Temp:  36.4 C  Resp: 16 20    Last Pain:  Filed Vitals:   01/07/16 2200  PainSc: 0-No pain                 Shelton Silvas

## 2016-01-07 NOTE — Transfer of Care (Signed)
Immediate Anesthesia Transfer of Care Note  Patient: Jessica Wilkins  Procedure(s) Performed: Procedure(s): CESAREAN SECTION (N/A)  Patient Location: PACU  Anesthesia Type:Regional  Level of Consciousness: awake, alert  and oriented  Airway & Oxygen Therapy: Patient Spontanous Breathing  Post-op Assessment: Report given to RN and Post -op Vital signs reviewed and stable  Post vital signs: Reviewed and stable  Last Vitals:  Filed Vitals:   01/07/16 1930 01/07/16 1945  BP: 147/109 143/92  Pulse: 104 99  Temp:    Resp:      Complications: No apparent anesthesia complications

## 2016-01-07 NOTE — Addendum Note (Signed)
Addendum  created 01/07/16 2230 by Orlie Pollen, CRNA   Modules edited: Anesthesia Responsible Staff

## 2016-01-07 NOTE — Anesthesia Procedure Notes (Addendum)
Spinal Patient location during procedure: OR Start time: 01/07/2016 8:20 PM End time: 01/07/2016 8:22 PM Staffing Anesthesiologist: Suella Broad D Preanesthetic Checklist Completed: patient identified, site marked, surgical consent, pre-op evaluation, timeout performed, IV checked, risks and benefits discussed and monitors and equipment checked Spinal Block Patient position: sitting Prep: Betadine Patient monitoring: heart rate, continuous pulse ox, blood pressure and cardiac monitor Approach: midline Location: L4-5 Injection technique: single-shot Needle Needle type: Tuohy  Needle gauge: 17g. Needle length: 9 cm Catheter type: closed end flexible Catheter size: 20 g Catheter at skin depth: 10 cm Additional Notes Negative paresthesia. Negative blood return. Positive free-flowing CSF. Expiration date of kit checked and confirmed. Patient tolerated procedure well, without complications.  CSE performed. Initial LOR @ 5.   Patient identified. Risks/Benefits/Options discussed with patient including but not limited to bleeding, infection, nerve damage, paralysis, failed block, incomplete pain control, headache, blood pressure changes, nausea, vomiting, reactions to medications, itching and postpartum back pain. Confirmed with bedside nurse the patient's most recent platelet count. Confirmed with patient that they are not currently taking any anticoagulation, have any bleeding history or any family history of bleeding disorders. Patient expressed understanding and wished to proceed. All questions were answered. Sterile technique was used throughout the entire procedure. Please see nursing notes for vital signs. Test dose was given through epidural catheter and negative prior to continuing to dose epidural or start infusion. Warning signs of high block given to the patient including shortness of breath, tingling/numbness in hands, complete motor block, or any concerning symptoms with instructions  to call for help. Patient was given instructions on fall risk and not to get out of bed. All questions and concerns addressed with instructions to call with any issues or inadequate analgesia.

## 2016-01-07 NOTE — Anesthesia Preprocedure Evaluation (Addendum)
Anesthesia Evaluation  Patient identified by MRN, date of birth, ID band Patient awake    Reviewed: Allergy & Precautions, NPO status , Patient's Chart, lab work & pertinent test results, reviewed documented beta blocker date and time   Airway Mallampati: III  TM Distance: >3 FB Neck ROM: Full    Dental  (+) Teeth Intact   Pulmonary neg pulmonary ROS,    breath sounds clear to auscultation       Cardiovascular hypertension, Pt. on home beta blockers and Pt. on medications  Rhythm:Regular Rate:Normal     Neuro/Psych negative neurological ROS  negative psych ROS   GI/Hepatic Neg liver ROS, GERD  Medicated,  Endo/Other  negative endocrine ROS  Renal/GU negative Renal ROS  negative genitourinary   Musculoskeletal negative musculoskeletal ROS (+)   Abdominal   Peds negative pediatric ROS (+)  Hematology negative hematology ROS (+)   Anesthesia Other Findings   Reproductive/Obstetrics (+) Pregnancy                            Lab Results  Component Value Date   WBC 7.7 01/07/2016   HGB 12.0 01/07/2016   HCT 35.4* 01/07/2016   MCV 92.4 01/07/2016   PLT 145* 01/07/2016   Lab Results  Component Value Date   CREATININE 0.68 01/07/2016   BUN 8 01/07/2016   NA 135 01/07/2016   K 4.7 01/07/2016   CL 105 01/07/2016   CO2 23 01/07/2016   No results found for: INR, PROTIME   Anesthesia Physical Anesthesia Plan  ASA: III  Anesthesia Plan: Spinal   Post-op Pain Management:    Induction:   Airway Management Planned: Natural Airway  Additional Equipment:   Intra-op Plan:   Post-operative Plan:   Informed Consent: I have reviewed the patients History and Physical, chart, labs and discussed the procedure including the risks, benefits and alternatives for the proposed anesthesia with the patient or authorized representative who has indicated his/her understanding and acceptance.    Dental advisory given  Plan Discussed with: CRNA  Anesthesia Plan Comments:         Anesthesia Quick Evaluation

## 2016-01-08 ENCOUNTER — Encounter (HOSPITAL_COMMUNITY): Payer: Self-pay | Admitting: Obstetrics and Gynecology

## 2016-01-08 ENCOUNTER — Other Ambulatory Visit: Payer: BLUE CROSS/BLUE SHIELD

## 2016-01-08 ENCOUNTER — Ambulatory Visit (HOSPITAL_COMMUNITY): Payer: BLUE CROSS/BLUE SHIELD

## 2016-01-08 DIAGNOSIS — Z9851 Tubal ligation status: Secondary | ICD-10-CM

## 2016-01-08 LAB — COMPREHENSIVE METABOLIC PANEL
ALBUMIN: 2.3 g/dL — AB (ref 3.5–5.0)
ALT: 40 U/L (ref 14–54)
AST: 46 U/L — AB (ref 15–41)
Alkaline Phosphatase: 130 U/L — ABNORMAL HIGH (ref 38–126)
Anion gap: 8 (ref 5–15)
BUN: 9 mg/dL (ref 6–20)
CHLORIDE: 102 mmol/L (ref 101–111)
CO2: 25 mmol/L (ref 22–32)
CREATININE: 0.81 mg/dL (ref 0.44–1.00)
Calcium: 7.9 mg/dL — ABNORMAL LOW (ref 8.9–10.3)
GFR calc Af Amer: >60 mL/min (ref >60)
GFR calc non Af Amer: >60 mL/min (ref >60)
Glucose, Bld: 99 mg/dL (ref 65–99)
Potassium: 4.2 mmol/L (ref 3.5–5.1)
SODIUM: 135 mmol/L (ref 135–145)
Total Bilirubin: 0.5 mg/dL (ref 0.3–1.2)
Total Protein: 5.1 g/dL — ABNORMAL LOW (ref 6.5–8.1)

## 2016-01-08 LAB — CBC
HCT: 32 % — ABNORMAL LOW (ref 36.0–46.0)
Hemoglobin: 10.7 g/dL — ABNORMAL LOW (ref 12.0–15.0)
MCH: 31 pg (ref 26.0–34.0)
MCHC: 33.4 g/dL (ref 30.0–36.0)
MCV: 92.8 fL (ref 78.0–100.0)
PLATELETS: 120 10*3/uL — AB (ref 150–400)
RBC: 3.45 MIL/uL — AB (ref 3.87–5.11)
RDW: 12.9 % (ref 11.5–15.5)
WBC: 10 10*3/uL (ref 4.0–10.5)

## 2016-01-08 LAB — ABO/RH: ABO/RH(D): O POS

## 2016-01-08 LAB — LACTATE DEHYDROGENASE: LDH: 271 U/L — ABNORMAL HIGH (ref 98–192)

## 2016-01-08 LAB — URIC ACID: URIC ACID, SERUM: 7.1 mg/dL — AB (ref 2.3–6.6)

## 2016-01-08 MED ORDER — LANOLIN HYDROUS EX OINT
1.0000 | TOPICAL_OINTMENT | CUTANEOUS | Status: DC | PRN
Start: 2016-01-08 — End: 2016-01-11

## 2016-01-08 MED ORDER — MENTHOL 3 MG MT LOZG: 1.0000 | LOZENGE | OROMUCOSAL | Status: DC | PRN

## 2016-01-08 MED ORDER — PANTOPRAZOLE SODIUM 40 MG PO TBEC
40.0000 mg | DELAYED_RELEASE_TABLET | Freq: Every day | ORAL | Status: DC
  Administered 2016-01-08 – 2016-01-11 (×4): 40 mg via ORAL
  Filled 2016-01-08 (×3): qty 1

## 2016-01-08 MED ORDER — TETANUS-DIPHTH-ACELL PERTUSSIS 5-2.5-18.5 LF-MCG/0.5 IM SUSP
0.5000 mL | Freq: Once | INTRAMUSCULAR | Status: DC
  Filled 2016-01-08: qty 0.5

## 2016-01-08 MED ORDER — SIMETHICONE 80 MG PO CHEW
80.0000 mg | CHEWABLE_TABLET | Freq: Three times a day (TID) | ORAL | Status: DC
  Administered 2016-01-08 – 2016-01-11 (×11): 80 mg via ORAL
  Filled 2016-01-08 (×11): qty 1

## 2016-01-08 MED ORDER — PRENATAL MULTIVITAMIN CH
1.0000 | ORAL_TABLET | Freq: Every day | ORAL | Status: DC
Start: 2016-01-08 — End: 2016-01-11
  Administered 2016-01-08 – 2016-01-11 (×4): 1 via ORAL
  Filled 2016-01-08 (×4): qty 1

## 2016-01-08 MED ORDER — OXYCODONE-ACETAMINOPHEN 5-325 MG PO TABS
1.0000 | ORAL_TABLET | ORAL | Status: DC | PRN
  Administered 2016-01-08: 2 via ORAL
  Administered 2016-01-08: 1 via ORAL
  Administered 2016-01-08 – 2016-01-10 (×10): 2 via ORAL
  Administered 2016-01-11 (×2): 1 via ORAL
  Administered 2016-01-11: 2 via ORAL
  Filled 2016-01-08: qty 1
  Filled 2016-01-08 (×4): qty 2
  Filled 2016-01-08: qty 1
  Filled 2016-01-08: qty 2
  Filled 2016-01-08 (×2): qty 1
  Filled 2016-01-08: qty 2
  Filled 2016-01-08: qty 1
  Filled 2016-01-08: qty 2
  Filled 2016-01-08: qty 1
  Filled 2016-01-08 (×3): qty 2
  Filled 2016-01-08: qty 1

## 2016-01-08 MED ORDER — SENNOSIDES-DOCUSATE SODIUM 8.6-50 MG PO TABS
2.0000 | ORAL_TABLET | ORAL | Status: DC
  Administered 2016-01-09 – 2016-01-11 (×3): 2 via ORAL
  Filled 2016-01-08 (×3): qty 2

## 2016-01-08 MED ORDER — LABETALOL HCL 100 MG PO TABS
100.0000 mg | ORAL_TABLET | Freq: Every day | ORAL | Status: DC
  Administered 2016-01-08 – 2016-01-11 (×4): 100 mg via ORAL
  Filled 2016-01-08 (×6): qty 1

## 2016-01-08 MED ORDER — SIMETHICONE 80 MG PO CHEW: 80.0000 mg | CHEWABLE_TABLET | ORAL | Status: DC | PRN

## 2016-01-08 MED ORDER — LACTATED RINGERS IV SOLN
INTRAVENOUS | Status: DC
  Administered 2016-01-08 – 2016-01-09 (×2): via INTRAVENOUS

## 2016-01-08 MED ORDER — OXYTOCIN 10 UNIT/ML IJ SOLN: 2.5000 [IU]/h | INTRAVENOUS | Status: AC

## 2016-01-08 MED ORDER — DIPHENHYDRAMINE HCL 25 MG PO CAPS: 25.0000 mg | ORAL_CAPSULE | Freq: Four times a day (QID) | ORAL | Status: DC | PRN

## 2016-01-08 MED ORDER — ACETAMINOPHEN 325 MG PO TABS: 650.0000 mg | ORAL_TABLET | ORAL | Status: DC | PRN

## 2016-01-08 MED ORDER — CYCLOBENZAPRINE HCL 5 MG PO TABS
5.0000 mg | ORAL_TABLET | Freq: Four times a day (QID) | ORAL | Status: DC | PRN
  Administered 2016-01-08 – 2016-01-09 (×2): 5 mg via ORAL
  Filled 2016-01-08 (×3): qty 2

## 2016-01-08 MED ORDER — SIMETHICONE 80 MG PO CHEW
80.0000 mg | CHEWABLE_TABLET | ORAL | Status: DC
  Administered 2016-01-10: 80 mg via ORAL
  Filled 2016-01-08 (×2): qty 1

## 2016-01-08 MED ORDER — WITCH HAZEL-GLYCERIN EX PADS: 1.0000 "application " | MEDICATED_PAD | CUTANEOUS | Status: DC | PRN

## 2016-01-08 MED ORDER — ZOLPIDEM TARTRATE 5 MG PO TABS: 5.0000 mg | ORAL_TABLET | Freq: Every evening | ORAL | Status: DC | PRN

## 2016-01-08 MED ORDER — ACETAMINOPHEN 10 MG/ML IV SOLN
1000.0000 mg | Freq: Once | INTRAVENOUS | Status: AC
  Administered 2016-01-08: 1000 mg via INTRAVENOUS
  Filled 2016-01-08: qty 100

## 2016-01-08 MED ORDER — IBUPROFEN 600 MG PO TABS
600.0000 mg | ORAL_TABLET | Freq: Four times a day (QID) | ORAL | Status: DC
  Administered 2016-01-08 – 2016-01-11 (×13): 600 mg via ORAL
  Filled 2016-01-08 (×13): qty 1

## 2016-01-08 MED ORDER — DIBUCAINE 1 % RE OINT: 1.0000 "application " | TOPICAL_OINTMENT | RECTAL | Status: DC | PRN

## 2016-01-08 NOTE — Addendum Note (Signed)
Addendum  created 01/08/16 1006 by Jhonnie Garner, CRNA   Modules edited: Clinical Notes   Clinical Notes:  File: [id number]

## 2016-01-08 NOTE — Progress Notes (Signed)
S:  Patient is doing well.  No complaints.  Babies in the NICU.  O:  BP 133/82 mmHg  Pulse 86  Temp(Src) 98.2 F (36.8 C) (Oral)  Resp 18  Ht  (1.727 m)  Wt 251 lb 9.6 oz (114.125 kg)  BMI 38.26 kg/m2  SpO2 91%  Breastfeeding? Unknown Results for orders placed or performed during the hospital encounter of 01/07/16 (from the past 24 hour(s))  Urinalysis, Routine w reflex microscopic (not at [facility])     Status: Abnormal   Collection Time: 01/07/16  3:00 PM  Result Value Ref Range   Color, Urine YELLOW YELLOW   APPearance HAZY (A) CLEAR   Specific Gravity, Urine 1.010 1.005 - 1.030   pH 6.0 5.0 - 8.0   Glucose, UA NEGATIVE NEGATIVE mg/dL   Hgb urine dipstick SMALL (A) NEGATIVE   Bilirubin Urine NEGATIVE NEGATIVE   Ketones, ur NEGATIVE NEGATIVE mg/dL   Protein, ur 30 (A) NEGATIVE mg/dL   Nitrite NEGATIVE NEGATIVE   Leukocytes, UA SMALL (A) NEGATIVE  Protein / creatinine ratio, urine     Status: Abnormal   Collection Time: 01/07/16  3:00 PM  Result Value Ref Range   Creatinine, Urine 23.00 mg/dL   Total Protein, Urine 45 mg/dL   Protein Creatinine Ratio 1.96 (H) 0.00 - 0.15 mg/mg[Cre]  Urine microscopic-add on     Status: Abnormal   Collection Time: 01/07/16  3:00 PM  Result Value Ref Range   Squamous Epithelial / LPF 0-5 (A) NONE SEEN   WBC, UA 0-5 0 - 5 WBC/hpf   RBC / HPF 0-5 0 - 5 RBC/hpf   Bacteria, UA NONE SEEN NONE SEEN  CBC     Status: Abnormal   Collection Time: 01/07/16  4:02 PM  Result Value Ref Range   WBC 7.7 4.0 - 10.5 K/uL   RBC 3.83 (L) 3.87 - 5.11 MIL/uL   Hemoglobin 12.0 12.0 - 15.0 g/dL   HCT 16.1 (L) 09.6 - 04.5 %   MCV 92.4 78.0 - 100.0 fL   MCH 31.3 26.0 - 34.0 pg   MCHC 33.9 30.0 - 36.0 g/dL   RDW 40.9 81.1 - 91.4 %   Platelets 145 (L) 150 - 400 K/uL  Comprehensive metabolic panel     Status: Abnormal   Collection Time: 01/07/16  4:02 PM  Result Value Ref Range   Sodium 135 135 - 145 mmol/L   Potassium 4.7 3.5 - 5.1 mmol/L   Chloride 105  101 - 111 mmol/L   CO2 23 22 - 32 mmol/L   Glucose, Bld 87 65 - 99 mg/dL   BUN 8 6 - 20 mg/dL   Creatinine, Ser 7.82 0.44 - 1.00 mg/dL   Calcium 8.9 8.9 - 95.6 mg/dL   Total Protein 5.9 (L) 6.5 - 8.1 g/dL   Albumin 2.7 (L) 3.5 - 5.0 g/dL   AST 58 (H) 15 - 41 U/L   ALT 49 14 - 54 U/L   Alkaline Phosphatase 155 (H) 38 - 126 U/L   Total Bilirubin 0.6 0.3 - 1.2 mg/dL   GFR calc non Af Amer >60 >60 mL/min   GFR calc Af Amer >60 >60 mL/min   Anion gap 7 5 - 15  Uric acid     Status: Abnormal   Collection Time: 01/07/16  4:02 PM  Result Value Ref Range   Uric Acid, Serum 6.7 (H) 2.3 - 6.6 mg/dL  Lactate dehydrogenase     Status: Abnormal   Collection  Time: 01/07/16  4:02 PM  Result Value Ref Range   LDH 228 (H) 98 - 192 U/L  ABO/Rh     Status: None   Collection Time: 01/07/16  4:02 PM  Result Value Ref Range   ABO/RH(D) O POS   Type and screen Type and Screen on C/S only     Status: None   Collection Time: 01/07/16  4:09 PM  Result Value Ref Range   ABO/RH(D) O POS    Antibody Screen NEG    Sample Expiration 01/10/2016   CBC     Status: Abnormal   Collection Time: 01/08/16  8:14 AM  Result Value Ref Range   WBC 10.0 4.0 - 10.5 K/uL   RBC 3.45 (L) 3.87 - 5.11 MIL/uL   Hemoglobin 10.7 (L) 12.0 - 15.0 g/dL   HCT 09.8 (L) 11.9 - 14.7 %   MCV 92.8 78.0 - 100.0 fL   MCH 31.0 26.0 - 34.0 pg   MCHC 33.4 30.0 - 36.0 g/dL   RDW 82.9 56.2 - 13.0 %   Platelets 120 (L) 150 - 400 K/uL   Abdomen is soft and non tender Bandage is clean and dry  IMPRESSION: POD #1 Severe Preeclampsia - / HELLP  PLAN: Continue Magnesium until diuresis and platelets increasing Routine care otherwise

## 2016-01-08 NOTE — Lactation Note (Signed)
This note was copied from the chart of [name]. Lactation Consultation Note LPI NICU twins, mom is going to pump and give colostrum when she can. Mom shown how to use DEBP & how to disassemble, clean, & reassemble parts. Mom knows to pump q3h for 15-20 min. Gave vials to put colostrum in. RN is getting labels. Hand expression taught w/colostrum glistening noted. Explained to mom it was for mainly stimulation, but what ever colostrum she pumped she could give it to the girls. Her first child is 67 yrs old and BF him for 5 weeks.  Gave NICU booklet briefly reviewed colostrum storage and visiting hours for NICU. Encouraged STS. Gave WH/LC brochure given w/resources, support groups and LC services. Answered any questions mom had about pumping. Mom very tired and wanting to go see babies.  Patient Name: Jessica Wilkins [id number] Date: 01/08/2016 Reason for consult: Initial assessment   Maternal Data Does the patient have breastfeeding experience prior to this delivery?: Yes  Feeding    LATCH Score/Interventions                      Lactation Tools Discussed/Used Tools: Pump Breast pump type: Double-Electric Breast Pump WIC Program: No Pump Review: Setup, frequency, and cleaning;Milk Storage Initiated by:: Peri Jefferson RN Date initiated:: 01/08/16   Consult Status Consult Status: Follow-up Date: 01/08/16 Follow-up type: In-patient    Charyl Dancer 01/08/2016, 4:37 AM

## 2016-01-08 NOTE — Progress Notes (Signed)
Patient off unit. In NICU. E.Shaurya Rawdon RN

## 2016-01-08 NOTE — Anesthesia Postprocedure Evaluation (Signed)
Anesthesia Post Note  Patient: Jessica Wilkins  Procedure(s) Performed: Procedure(s) (LRB): CESAREAN SECTION (N/A)  Patient location during evaluation: A-ICU Anesthesia Type: Spinal Level of consciousness: awake and alert and oriented Pain management: pain level controlled Vital Signs Assessment: post-procedure vital signs reviewed and stable Respiratory status: spontaneous breathing Cardiovascular status: blood pressure returned to baseline Postop Assessment: no headache, no backache, patient able to bend at knees, no signs of nausea or vomiting and adequate PO intake Anesthetic complications: no    Last Vitals:  Filed Vitals:   01/08/16 0658 01/08/16 0900  BP: 133/82   Pulse: 86   Temp: 36.8 C 36.8 C  Resp: 18     Last Pain:  Filed Vitals:   01/08/16 0924  PainSc: 0-No pain                 Jailon Schaible

## 2016-01-08 NOTE — Lactation Note (Addendum)
This note was copied from the chart of [name]. Lactation Consultation Note  Patient Name: Jessica Wilkins [id number] Date: 01/08/2016 Reason for consult: Initial assessment;NICU baby;Late preterm infant;Multiple gestation   Follow up with mom in AICU. Mom is pumping for 3 rd time and received 1 gtt to right breast via pump. Mom's nipples and areola were being pulled down into flange, changed her to # 21 flange for today, encouraged her to change back to # 24 if any irritation noted around nipples. Showed mom how to hand express, received about 4 large gtts from right side. Mom demonstrated return of hand expression. Mom was using maintenance setting, enc her to change to Initiate setting on Pump until obtains 20 ml x 3 pumpings. Mom is worried that she is not getting enough, advised her that pumping is for stimulation and that more is usually obtained in early Post partum period by hand expression. Enc mom to pump every 2-3 hours for 15 minutes with DEBP in Initiate setting followed by hand expression. Enc mom to call with questions/concerns prn. Gave mom Providing Milk for your baby in NICU Booklet. She has colostrum collection containers at bedside and # labels for vials.    Maternal Data Formula Feeding for Exclusion: No Has patient been taught Hand Expression?: Yes Does the patient have breastfeeding experience prior to this delivery?: Yes  Feeding    LATCH Score/Interventions                      Lactation Tools Discussed/Used WIC Program: No Pump Review: Setup, frequency, and cleaning;Milk Storage Initiated by:: Noralee Stain, RN, IBCLC Date initiated:: 01/08/16   Consult Status Consult Status: Follow-up Date: 01/09/16 Follow-up type: In-patient    Jessica Wilkins 01/08/2016, 10:03 AM

## 2016-01-08 NOTE — Progress Notes (Addendum)
Initial visit with patient who has twin daughters in the NICU.  She shared that she was doing okay, but missing her girls.  She was able to meet them at 2:30 last night and was on her way down to see them again when I stopped by.  I assisted AC Paige in rolling her IV down to NICU.  Mrs Whaling shared that the girls are named Jarold Song and [name] and she has an almost 36 year old son who is coming to visit soon.  I introduced spiritual care services and informed her of the support we can provide the patient and her daughters during their hospitalization and after.  Will continue to follow.  Please page as further needs arise.  Maryanna Shape. Carley Hammed, M.Div. [facility] Chaplain Pager [phone] Office [phone]     01/08/16 1244  Clinical Encounter Type  Visited With Patient  Visit Type Initial  Referral From Patient  Consult/Referral To Chaplain  Spiritual Encounters  Spiritual Needs Emotional  Stress Factors  Patient Stress Factors Major life changes

## 2016-01-09 ENCOUNTER — Encounter (HOSPITAL_COMMUNITY): Payer: Self-pay | Admitting: *Deleted

## 2016-01-09 LAB — CBC
HEMATOCRIT: 34.8 % — AB (ref 36.0–46.0)
HEMOGLOBIN: 11.6 g/dL — AB (ref 12.0–15.0)
MCH: 31.4 pg (ref 26.0–34.0)
MCHC: 33.3 g/dL (ref 30.0–36.0)
MCV: 94.1 fL (ref 78.0–100.0)
Platelets: 136 10*3/uL — ABNORMAL LOW (ref 150–400)
RBC: 3.7 MIL/uL — ABNORMAL LOW (ref 3.87–5.11)
RDW: 13 % (ref 11.5–15.5)
WBC: 9 10*3/uL (ref 4.0–10.5)

## 2016-01-09 LAB — COMPREHENSIVE METABOLIC PANEL
ALK PHOS: 136 U/L — AB (ref 38–126)
ALT: 38 U/L (ref 14–54)
AST: 43 U/L — AB (ref 15–41)
Albumin: 2.5 g/dL — ABNORMAL LOW (ref 3.5–5.0)
Anion gap: 7 (ref 5–15)
BILIRUBIN TOTAL: 0.5 mg/dL (ref 0.3–1.2)
BUN: 7 mg/dL (ref 6–20)
CALCIUM: 7 mg/dL — AB (ref 8.9–10.3)
CO2: 28 mmol/L (ref 22–32)
CREATININE: 0.73 mg/dL (ref 0.44–1.00)
Chloride: 105 mmol/L (ref 101–111)
GFR calc Af Amer: >60 mL/min (ref >60)
Glucose, Bld: 104 mg/dL — ABNORMAL HIGH (ref 65–99)
POTASSIUM: 3.5 mmol/L (ref 3.5–5.1)
Sodium: 140 mmol/L (ref 135–145)
TOTAL PROTEIN: 5.6 g/dL — AB (ref 6.5–8.1)

## 2016-01-09 LAB — RPR: RPR: NONREACTIVE

## 2016-01-09 MED ORDER — SODIUM CHLORIDE 0.9% FLUSH: 3.0000 mL | INTRAVENOUS | Status: DC | PRN

## 2016-01-09 MED ORDER — SODIUM CHLORIDE 0.9% FLUSH
3.0000 mL | Freq: Two times a day (BID) | INTRAVENOUS | Status: DC
  Administered 2016-01-09 (×2): 3 mL via INTRAVENOUS

## 2016-01-09 NOTE — Progress Notes (Signed)
S:  Patient is doing well. Just complains of some right shoulder pain.  O:  Afebrile  Vital signs stable BP 139/84 mmHg  Pulse 82  Temp(Src) 98.4 F (36.9 C) (Oral)  Resp 18  Ht  (1.727 m)  Wt 242 lb (109.77 kg)  BMI 36.80 kg/m2  SpO2 97%  Breastfeeding? Unknown Results for orders placed or performed during the hospital encounter of 01/07/16 (from the past 24 hour(s))  CBC     Status: Abnormal   Collection Time: 01/08/16  8:14 AM  Result Value Ref Range   WBC 10.0 4.0 - 10.5 K/uL   RBC 3.45 (L) 3.87 - 5.11 MIL/uL   Hemoglobin 10.7 (L) 12.0 - 15.0 g/dL   HCT 16.1 (L) 09.6 - 04.5 %   MCV 92.8 78.0 - 100.0 fL   MCH 31.0 26.0 - 34.0 pg   MCHC 33.4 30.0 - 36.0 g/dL   RDW 40.9 81.1 - 91.4 %   Platelets 120 (L) 150 - 400 K/uL  Comprehensive metabolic panel     Status: Abnormal   Collection Time: 01/08/16  8:14 AM  Result Value Ref Range   Sodium 135 135 - 145 mmol/L   Potassium 4.2 3.5 - 5.1 mmol/L   Chloride 102 101 - 111 mmol/L   CO2 25 22 - 32 mmol/L   Glucose, Bld 99 65 - 99 mg/dL   BUN 9 6 - 20 mg/dL   Creatinine, Ser 7.82 0.44 - 1.00 mg/dL   Calcium 7.9 (L) 8.9 - 10.3 mg/dL   Total Protein 5.1 (L) 6.5 - 8.1 g/dL   Albumin 2.3 (L) 3.5 - 5.0 g/dL   AST 46 (H) 15 - 41 U/L   ALT 40 14 - 54 U/L   Alkaline Phosphatase 130 (H) 38 - 126 U/L   Total Bilirubin 0.5 0.3 - 1.2 mg/dL   GFR calc non Af Amer >60 >60 mL/min   GFR calc Af Amer >60 >60 mL/min   Anion gap 8 5 - 15  Lactate dehydrogenase     Status: Abnormal   Collection Time: 01/08/16  8:14 AM  Result Value Ref Range   LDH 271 (H) 98 - 192 U/L  Uric acid     Status: Abnormal   Collection Time: 01/08/16  8:14 AM  Result Value Ref Range   Uric Acid, Serum 7.1 (H) 2.3 - 6.6 mg/dL  CBC     Status: Abnormal   Collection Time: 01/09/16  6:19 AM  Result Value Ref Range   WBC 9.0 4.0 - 10.5 K/uL   RBC 3.70 (L) 3.87 - 5.11 MIL/uL   Hemoglobin 11.6 (L) 12.0 - 15.0 g/dL   HCT 95.6 (L) 21.3 - 08.6 %   MCV 94.1 78.0  - 100.0 fL   MCH 31.4 26.0 - 34.0 pg   MCHC 33.3 30.0 - 36.0 g/dL   RDW 57.8 46.9 - 62.9 %   Platelets 136 (L) 150 - 400 K/uL  Comprehensive metabolic panel     Status: Abnormal   Collection Time: 01/09/16  6:19 AM  Result Value Ref Range   Sodium 140 135 - 145 mmol/L   Potassium 3.5 3.5 - 5.1 mmol/L   Chloride 105 101 - 111 mmol/L   CO2 28 22 - 32 mmol/L   Glucose, Bld 104 (H) 65 - 99 mg/dL   BUN 7 6 - 20 mg/dL   Creatinine, Ser 5.28 0.44 - 1.00 mg/dL   Calcium 7.0 (L) 8.9 - 10.3 mg/dL  Total Protein 5.6 (L) 6.5 - 8.1 g/dL   Albumin 2.5 (L) 3.5 - 5.0 g/dL   AST 43 (H) 15 - 41 U/L   ALT 38 14 - 54 U/L   Alkaline Phosphatase 136 (H) 38 - 126 U/L   Total Bilirubin 0.5 0.3 - 1.2 mg/dL   GFR calc non Af Amer >60 >60 mL/min   GFR calc Af Amer >60 >60 mL/min   Anion gap 7 5 - 15   Abdomen is soft and non tender Bandage clean dry and intact  IMPRESSION: POD #2 Severe Preeclampsia  PLAN: Doing well Discontinue Magnesium Transfer to floor Should pain possibly due to positioning during C Section and/ or diaphragmatic irritation - discussed with patient

## 2016-01-10 NOTE — Lactation Note (Signed)
This note was copied from the chart of [name]. Lactation Consultation Note  Patient Name: Jessica Wilkins [id number] Date: 01/10/2016 Reason for consult: Follow-up assessment;NICU baby;Infant < 6lbs;Late preterm infant;Multiple gestation   Follow-up at 16 hrs old for NICU mom with 35.5 week twins.  Mom is a P2.  BF Hx with 36 yr old for 5 weeks.  Reports difficulty BF (poor feeder) with 36yo and ended up switching to formula. Mom reports pumping 5-6 times per day here in hospital, but at time of visit mom had only pumped once today. Mom reports is getting 5 ml colostrum with last few pumping sessions.   Reports really wanting to BF twins and is concerned of having a good milk supply; does not want twins to be dependent on formula. Mom demonstrated using "initiate" setting on DEBP.  LC encouraged turning dial up to 3-4 teardrops.   Taught how to use hands-on pumping with hand expression at end of pumping session.  Head-bands given to free up her hands for hands-on pumping.   Reviewed NICU booklet and keeping log in booklet.  Encouraged pumping at least 8 times per day (every 2 hrs during the day and at least once during the night). Encouraged mom to ask about latching infant's in NICU with RN's assistance and instructed mom to call tomorrow to set up time with NICU LC for feeding assistance. LC spent time with mom reassuring mom.  Discussed supply/demand, milk volume, and building milk supply with twins.   Consult Status Consult Status: Follow-up Date: 01/11/16 Follow-up type: In-patient    Lendon Ka 01/10/2016, 5:21 PM

## 2016-01-10 NOTE — Progress Notes (Signed)
Patient is doing well.    BP 135/79 mmHg  Pulse 67  Temp(Src) 97.6 F (36.4 C) (Oral)  Resp 20  Ht  (1.727 m)  Wt 242 lb (109.77 kg)  BMI 36.80 kg/m2  SpO2 97%  Breastfeeding? Unknown Results for orders placed or performed during the hospital encounter of 01/07/16 (from the past 24 hour(s))  CBC     Status: Abnormal   Collection Time: 01/09/16  6:19 AM  Result Value Ref Range   WBC 9.0 4.0 - 10.5 K/uL   RBC 3.70 (L) 3.87 - 5.11 MIL/uL   Hemoglobin 11.6 (L) 12.0 - 15.0 g/dL   HCT 16.1 (L) 09.6 - 04.5 %   MCV 94.1 78.0 - 100.0 fL   MCH 31.4 26.0 - 34.0 pg   MCHC 33.3 30.0 - 36.0 g/dL   RDW 40.9 81.1 - 91.4 %   Platelets 136 (L) 150 - 400 K/uL  Comprehensive metabolic panel     Status: Abnormal   Collection Time: 01/09/16  6:19 AM  Result Value Ref Range   Sodium 140 135 - 145 mmol/L   Potassium 3.5 3.5 - 5.1 mmol/L   Chloride 105 101 - 111 mmol/L   CO2 28 22 - 32 mmol/L   Glucose, Bld 104 (H) 65 - 99 mg/dL   BUN 7 6 - 20 mg/dL   Creatinine, Ser 7.82 0.44 - 1.00 mg/dL   Calcium 7.0 (L) 8.9 - 10.3 mg/dL   Total Protein 5.6 (L) 6.5 - 8.1 g/dL   Albumin 2.5 (L) 3.5 - 5.0 g/dL   AST 43 (H) 15 - 41 U/L   ALT 38 14 - 54 U/L   Alkaline Phosphatase 136 (H) 38 - 126 U/L   Total Bilirubin 0.5 0.3 - 1.2 mg/dL   GFR calc non Af Amer >60 >60 mL/min   GFR calc Af Amer >60 >60 mL/min   Anion gap 7 5 - 15  RPR     Status: None   Collection Time: 01/09/16  6:19 AM  Result Value Ref Range   RPR Ser Ql Non Reactive Non Reactive   Abdomen is soft and non tender Bandage clean and dry and intact  POD #3 Severe Preeclampsia  Doing well Routine care Discharge home tomorrow Babies doing well in NICU

## 2016-01-11 ENCOUNTER — Other Ambulatory Visit (HOSPITAL_COMMUNITY): Payer: BLUE CROSS/BLUE SHIELD

## 2016-01-11 MED ORDER — OXYCODONE-ACETAMINOPHEN 5-325 MG PO TABS: 1.0000 | ORAL_TABLET | ORAL | Status: DC | PRN

## 2016-01-11 MED ORDER — IBUPROFEN 600 MG PO TABS: 600.0000 mg | ORAL_TABLET | Freq: Four times a day (QID) | ORAL | Status: DC

## 2016-01-11 NOTE — Discharge Summary (Signed)
Obstetric Discharge Summary Reason for Admission: observation/evaluation Prenatal Procedures: ultrasound Intrapartum Procedures: cesarean: low cervical, transverse Postpartum Procedures: none Complications-Operative and Postpartum: none HEMOGLOBIN  Date Value Ref Range Status  01/09/2016 11.6* 12.0 - 15.0 Wilkins/dL Final   HCT  Date Value Ref Range Status  01/09/2016 34.8* 36.0 - 46.0 % Final    Physical Exam:  General: alert and cooperative Lochia: appropriate Uterine Fundus: firm Incision: healing well DVT Evaluation: No evidence of DVT seen on physical exam. Negative Homan's sign. No cords or calf tenderness. Calf/Ankle edema is present.  Discharge Diagnoses: Term Pregnancy-delivered, severe preeclampsia  Discharge Information: Date: 01/11/2016 Activity: pelvic rest Diet: routine Medications: PNV, Ibuprofen, Percocet and labetalol Condition: stable Instructions: refer to practice specific booklet Discharge to: home   Newborn Data:   Jessica, Wilkins [[id number]]  Live born female  Birth Weight: 6 lb 6.7 oz (2910 Wilkins) APGAR: 7, 7   Jessica, Wilkins [[id number]]  Live born female  Birth Weight: 5 lb 10.7 oz (2570 Wilkins) APGAR: 9, 9  Babies may be discharged later today  Jessica Wilkins 01/11/2016, 8:42 AM

## 2016-01-11 NOTE — Progress Notes (Addendum)
Pt verbalizes understanding of d/c instructions, medications, follow up appts, when to seek medical attention and belongings policy. Pt has no questions at time of d/c. Pt has no IV at this time. Pt and FOB are taking belongings down to their car and this time and then going back to NICU to visit with babies. Pt was given a copy of d/c instructions as well as prescriptions. Sheryn Bison

## 2016-01-11 NOTE — Clinical Social Work Maternal (Signed)
CLINICAL SOCIAL WORK MATERNAL/CHILD NOTE  Patient Details  Name: Jessica Wilkins MRN: [id number] Date of Birth: 10/14/1981  Date:  03-06-2016  Clinical Social Worker Initiating Note:  Shanicka Oldenkamp E. Brigitte Pulse, [place] Date/ Time Initiated:  Dec 14, 2015/1200     Child's Name:  Jessica Wilkins, B: Jessica Wilkins   Legal Guardian:   (Parents: Jessica Wilkins and Jessica Wilkins)   Need for Interpreter:  None   Date of Referral:        Reason for Referral:   (No referral-NICU admission)   Referral Source:      Address:  [address], [place], [place] [address]  Phone number:  [phone]   Household Members:  Minor Children, Significant Other (Parents have one other child/Jessica Wilkins, age 72)   Natural Supports (not living in the home):  Immediate Family (MOB reports that her mother is her greatest support person other than FOB.  She reports FOB's mother lives in [place] and plans to visit.)   Professional Supports:     Employment: Full-time   Type of Work:  (FOB is a Health and safety inspector at [employer]  MOB handles "escalated calls" for [employer].)   Education:      Museum/gallery curator Resources:  [employer]   Other Resources:   (MOB has looked into [facility] and states they do not meet the qualifications.)   Cultural/Religious Considerations Which May Impact Care: None stated.    Strengths:  Ability to meet basic needs , Compliance with medical plan , Home prepared for child , Understanding of illness   Risk Factors/Current Problems:  None   Cognitive State:  Alert , Able to Concentrate , Linear Thinking , Goal Oriented , Insightful    Mood/Affect:  Relaxed , Comfortable , Interested    CSW Assessment: CSW met briefly with FOB in MOB's third floor room/305 to introduce services and inquire about how he is doing at this time.  FOB was pleasant and states they are doing well.  He states they are sad that they will be leaving today without the babies, but states understanding that they don't want them home before  they are ready.  He states no needs at this time and informed CSW that MOB was at bedside with babies in NICU.  CSW met with MOB at babies' bedsides to introduce services, offer support, and complete assessment due to babies admission to NICU at 35.5 weeks.  MOB was breast feeding baby A and was pleasant and welcoming of CSW's visit.  She states this is a good time to talk with her.  MOB provided update on babies and reports that baby A seems to be doing "better" than baby B.  She explained that baby B's heart rate dropped twice, so they want to monitor her for 5-7 days.  She acknowledges feeling of sadness, stating, "no one wants to leave their babies in the hospital."  CSW normalized this statement and validated her feelings.  CSW encouraged her to take this time to recover further physically and look at babies' hospitalizations as necessary and temporary.  However, CSW acknowledges how unnatural it is for a mother to be separated from her babies.  CSW encouraged her to call whenever she needs an update on her babies and to come as she is able.  She states no issues with transportation and states that her husband or mother will be able to bring her to the hospital since she is post surgery.   CSW inquired about how MOB felt emotionally throughout this pregnancy and whether  she identified heightened emotions after her first delivery.  MOB states that she was very emotional during this pregnancy and commented that this pregnancy was much harder physically than her first.  CSW spoke of how our physical health impacts our emotional health.  MOB feels that her high level of emotion was normal due to hormonal changes during pregnancy and states her emotions did not interfere with daily life.  She states no history of PPD after her first child.  CSW provided education regarding perinatal mood disorders and discussed the importance of talking with a medical professional if she has concerns about her emotional/mental  health at any time.  CSW offered for her to speak with CSW while babies are in the NICU and call after discharge if she has questions or needs.  MOB agreed and seemed appreciative of CSW's concern for her emotional wellbeing.  MOB states no emotional concerns at this time other than normal feelings of sadness at discharging prior to babies.   MOB states she thinks they have everything necessary for babies at home.  CSW asked specifically if they have a bed for each twin.  She reports plans to put their house on the market and move somewhere larger once she feels ready.  She states until they have a bigger place, they plan to have the girls in one crib in their bedroom.  CSW stated recommendation for each baby to sleep in their own crib and provided information on SIDS precautions.  CSW offered a bassinet if they are unable to obtain one, if they will use it.  MOB states they have a pack and play in addition to the crib.  CSW encouraged MOB to use both the pack and play and crib.  She stated agreement.   MOB reports having good supports and states FOB has had some time off this week and that her mother will take a week off once the babies are home.  She states PGM lives in [facility] and plans to come for a week or two, however, she is finishing chemo treatment currently.   CSW provided contact information and encouraged her to call CSW any time.  MOB stated appreciation for the visit.  CSW has no social concerns at this time and identifies no barriers to discharge when infants are medically ready.  CSW Plan/Description:  Patient/Family Education , Psychosocial Support and Ongoing Assessment of Needs    Alphonzo Cruise, [place] Dec 23, 2015, 12:59 PM

## 2016-01-11 NOTE — Lactation Note (Addendum)
This note was copied from the chart of Jessica Parker [place]. Lactation Consultation Note  Patient Name: Jessica Wilkins Today's Date: 01/11/2016 Reason for consult: Follow-up assessment;NICU baby;Infant < 6lbs;Multiple gestation   Follow up with mom of 84 hour old twins in NICU. Infants are being bottle fed EBM/Formula. Mom is pumping every 2-3 hours during day and did not pump last night. Enc mom to pump every 2-3 hours during day and take a 5-6 hour stretch at night to sleep. Mom is receiving about 5 ml/pumping. She says she is hand expressing post pumping and not getting much. She has bottles, labels and a personal DEBP at home for use. Encouraged her to use pumps in NICU when visiting the babies. Enc her to practice STS and to call for assistance with putting infants to breast while visiting in NICU. Mom with out further questions. She is to be d/c home today.    Maternal Data Formula Feeding for Exclusion: No Has patient been taught Hand Expression?: Yes Does the patient have breastfeeding experience prior to this delivery?: Yes  Feeding Feeding Type: Breast Milk with Formula added Nipple Type: Regular Length of feed: 15 min  LATCH Score/Interventions                      Lactation Tools Discussed/Used WIC Program: No Pump Review: Setup, frequency, and cleaning;Milk Storage   Consult Status Consult Status: PRN Follow-up type: Call as needed    [name] S [name] 01/11/2016, 9:24 AM    

## 2016-01-12 ENCOUNTER — Other Ambulatory Visit: Payer: BLUE CROSS/BLUE SHIELD

## 2016-01-12 ENCOUNTER — Inpatient Hospital Stay (HOSPITAL_COMMUNITY)
Admission: AD | Admit: 2016-01-12 | Discharge: 2016-01-14 | DRG: 776 | Disposition: A | Discharge status: Home / Self Care | Payer: BLUE CROSS/BLUE SHIELD | Source: Ambulatory Visit | Attending: Obstetrics and Gynecology | Admitting: Obstetrics and Gynecology

## 2016-01-12 ENCOUNTER — Encounter (HOSPITAL_COMMUNITY): Payer: Self-pay | Admitting: *Deleted

## 2016-01-12 DIAGNOSIS — Z8249 Family history of ischemic heart disease and other diseases of the circulatory system: Secondary | ICD-10-CM | POA: Diagnosis not present

## 2016-01-12 DIAGNOSIS — O1495 Unspecified pre-eclampsia, complicating the puerperium: Principal | ICD-10-CM | POA: Diagnosis present

## 2016-01-12 DIAGNOSIS — Z823 Family history of stroke: Secondary | ICD-10-CM

## 2016-01-12 DIAGNOSIS — R030 Elevated blood-pressure reading, without diagnosis of hypertension: Secondary | ICD-10-CM | POA: Diagnosis present

## 2016-01-12 LAB — CBC WITH DIFFERENTIAL/PLATELET
Basophils Absolute: 0 10*3/uL (ref 0.0–0.1)
Basophils Relative: 0 %
EOS ABS: 0.2 10*3/uL (ref 0.0–0.7)
EOS PCT: 3 %
HCT: 36 % (ref 36.0–46.0)
HEMOGLOBIN: 12.2 g/dL (ref 12.0–15.0)
Lymphocytes Relative: 18 %
Lymphs Abs: 1.4 10*3/uL (ref 0.7–4.0)
MCH: 31 pg (ref 26.0–34.0)
MCHC: 33.9 g/dL (ref 30.0–36.0)
MCV: 91.6 fL (ref 78.0–100.0)
MONOS PCT: 7 %
Monocytes Absolute: 0.6 10*3/uL (ref 0.1–1.0)
NEUTROS ABS: 5.8 10*3/uL (ref 1.7–7.7)
Neutrophils Relative %: 72 %
Platelets: 198 10*3/uL (ref 150–400)
RBC: 3.93 MIL/uL (ref 3.87–5.11)
RDW: 12.7 % (ref 11.5–15.5)
WBC: 8.1 10*3/uL (ref 4.0–10.5)

## 2016-01-12 LAB — COMPREHENSIVE METABOLIC PANEL
ALK PHOS: 121 U/L (ref 38–126)
ALT: 56 U/L — AB (ref 14–54)
ANION GAP: 6 (ref 5–15)
AST: 53 U/L — ABNORMAL HIGH (ref 15–41)
Albumin: 2.7 g/dL — ABNORMAL LOW (ref 3.5–5.0)
BILIRUBIN TOTAL: 0.6 mg/dL (ref 0.3–1.2)
BUN: 11 mg/dL (ref 6–20)
CALCIUM: 8.7 mg/dL — AB (ref 8.9–10.3)
CO2: 24 mmol/L (ref 22–32)
CREATININE: 0.64 mg/dL (ref 0.44–1.00)
Chloride: 106 mmol/L (ref 101–111)
Glucose, Bld: 104 mg/dL — ABNORMAL HIGH (ref 65–99)
Potassium: 4.3 mmol/L (ref 3.5–5.1)
SODIUM: 136 mmol/L (ref 135–145)
TOTAL PROTEIN: 6.3 g/dL — AB (ref 6.5–8.1)

## 2016-01-12 LAB — URINE MICROSCOPIC-ADD ON

## 2016-01-12 LAB — URINALYSIS, ROUTINE W REFLEX MICROSCOPIC
BILIRUBIN URINE: NEGATIVE
GLUCOSE, UA: NEGATIVE mg/dL
KETONES UR: NEGATIVE mg/dL
Nitrite: NEGATIVE
PH: 7 (ref 5.0–8.0)
PROTEIN: 30 mg/dL — AB
Specific Gravity, Urine: 1.01 (ref 1.005–1.030)

## 2016-01-12 LAB — LACTATE DEHYDROGENASE: LDH: 285 U/L — ABNORMAL HIGH (ref 98–192)

## 2016-01-12 LAB — URIC ACID: Uric Acid, Serum: 6.2 mg/dL (ref 2.3–6.6)

## 2016-01-12 LAB — PROTEIN / CREATININE RATIO, URINE
CREATININE, URINE: 25 mg/dL
PROTEIN CREATININE RATIO: 1.84 mg/mg{creat} — AB (ref 0.00–0.15)
TOTAL PROTEIN, URINE: 46 mg/dL

## 2016-01-12 MED ORDER — MAGNESIUM SULFATE 50 % IJ SOLN
2.0000 g/h | INTRAVENOUS | Status: AC
  Administered 2016-01-13: 2 g/h via INTRAVENOUS
  Filled 2016-01-12 (×2): qty 80

## 2016-01-12 MED ORDER — LABETALOL HCL 5 MG/ML IV SOLN: 20.0000 mg | INTRAVENOUS | Status: DC | PRN

## 2016-01-12 MED ORDER — MAGNESIUM HYDROXIDE 400 MG/5ML PO SUSP: 30.0000 mL | Freq: Every day | ORAL | Status: DC | PRN

## 2016-01-12 MED ORDER — LABETALOL HCL 100 MG PO TABS
100.0000 mg | ORAL_TABLET | Freq: Once | ORAL | Status: AC
  Administered 2016-01-12: 100 mg via ORAL
  Filled 2016-01-12: qty 1

## 2016-01-12 MED ORDER — ZOLPIDEM TARTRATE 5 MG PO TABS: 5.0000 mg | ORAL_TABLET | Freq: Every evening | ORAL | Status: DC | PRN

## 2016-01-12 MED ORDER — ONDANSETRON HCL 4 MG/2ML IJ SOLN: 4.0000 mg | Freq: Four times a day (QID) | INTRAMUSCULAR | Status: DC | PRN

## 2016-01-12 MED ORDER — HYDROMORPHONE HCL 1 MG/ML IJ SOLN: 0.2000 mg | INTRAMUSCULAR | Status: DC | PRN

## 2016-01-12 MED ORDER — MAGNESIUM SULFATE BOLUS VIA INFUSION
4.0000 g | Freq: Once | INTRAVENOUS | Status: AC
  Administered 2016-01-12: 4 g via INTRAVENOUS
  Filled 2016-01-12: qty 500

## 2016-01-12 MED ORDER — OXYCODONE-ACETAMINOPHEN 5-325 MG PO TABS: 1.0000 | ORAL_TABLET | ORAL | Status: DC | PRN

## 2016-01-12 MED ORDER — LABETALOL HCL 100 MG PO TABS
200.0000 mg | ORAL_TABLET | Freq: Two times a day (BID) | ORAL | Status: DC
  Administered 2016-01-12 – 2016-01-14 (×4): 200 mg via ORAL
  Filled 2016-01-12 (×5): qty 2

## 2016-01-12 MED ORDER — SENNA 8.6 MG PO TABS
1.0000 | ORAL_TABLET | Freq: Two times a day (BID) | ORAL | Status: DC
  Filled 2016-01-12 (×4): qty 1

## 2016-01-12 MED ORDER — MENTHOL 3 MG MT LOZG
1.0000 | LOZENGE | OROMUCOSAL | Status: DC | PRN
  Filled 2016-01-12: qty 9

## 2016-01-12 MED ORDER — ONDANSETRON HCL 4 MG PO TABS: 4.0000 mg | ORAL_TABLET | Freq: Four times a day (QID) | ORAL | Status: DC | PRN

## 2016-01-12 MED ORDER — CYCLOBENZAPRINE HCL 10 MG PO TABS
10.0000 mg | ORAL_TABLET | Freq: Three times a day (TID) | ORAL | Status: DC | PRN
  Administered 2016-01-12 – 2016-01-13 (×2): 10 mg via ORAL
  Filled 2016-01-12 (×2): qty 1

## 2016-01-12 MED ORDER — ALUM & MAG HYDROXIDE-SIMETH 200-200-20 MG/5ML PO SUSP: 30.0000 mL | ORAL | Status: DC | PRN

## 2016-01-12 MED ORDER — LACTATED RINGERS IV SOLN
INTRAVENOUS | Status: DC
  Administered 2016-01-12 – 2016-01-13 (×2): via INTRAVENOUS

## 2016-01-12 NOTE — MAU Provider Note (Signed)
History     CSN: [id number]  Arrival date and time: 01/12/16 1941   First Provider Initiated Contact with Patient 01/12/16 2007      Chief Complaint  Patient presents with  . Hypertension   HPI Ms. Jessica Wilkins is a 35 y.o. [id number] who presents to MAU today with complaint of elevated blood pressure. The patient was delivered by C/S on 01/07/16 for pre-eclampsia. The patient has been on Labetalol since October for tachycardia. She was initially taking 100 mg BID, but was reduced to 100 mg daily late in pregnancy due to hypotension. She developed elevated blood pressures late in pregnancy. Today she took BP at home and it was 175/100 prior to arrival in MAU. She states last Labetalol was taken this morning. She has had intermittent headache. Rates pain now at 3/10. She denied blurred vision, floaters, RUQ abdominal pain or significant peripheral edema. She has been taking Percocet and Motrin for incision pain. She denies fever. She is breastfeeding. Babies are in NICU for feeding issues.   OB History    Gravida Para Term Preterm AB TAB SAB Ectopic Multiple Living   Past Medical History  Diagnosis Date  . Hypertension     Past Surgical History  Procedure Laterality Date  . Cesarean section    . Cesarean section N/A 01/07/2016    Procedure: CESAREAN SECTION;  Surgeon: Candice Camp, MD;  Location: [facility];  Service: Obstetrics;  Laterality: N/A;    Family History  Problem Relation Age of Onset  . Stroke Paternal Grandmother   . Hypertension Paternal Grandmother   . Heart attack Neg Hx     Social History  Substance Use Topics  . Smoking status: Never Smoker   . Smokeless tobacco: None  . Alcohol Use: No    Allergies:  Allergies  Allergen Reactions  . Amoxicillin Hives  . Cephalexin Hives  . Penicillins Hives    Has patient had a PCN reaction causing immediate rash, facial/tongue/throat swelling, SOB or lightheadedness with hypotension: No Has  patient had a PCN reaction causing severe rash involving mucus membranes or skin necrosis: No Has patient had a PCN reaction that required hospitalization No Has patient had a PCN reaction occurring within the last 10 years: No If all of the above answers are "NO", then may proceed with Cephalosporin use.     Prescriptions prior to admission  Medication Sig Dispense Refill Last Dose  . acetaminophen (TYLENOL) 325 MG tablet Take 650 mg by mouth every 6 (six) hours as needed for mild pain.   Past Month at Unknown time  . ibuprofen (ADVIL,MOTRIN) 600 MG tablet Take 1 tablet (600 mg total) by mouth every 6 (six) hours. 30 tablet 1 01/12/2016 at 0700  . labetalol (NORMODYNE) 100 MG tablet Take 1 tablet (100 mg total) by mouth daily. (Patient taking differently: Take 100 mg by mouth 2 (two) times daily. ) 90 tablet 3 01/12/2016 at 0900  . oxyCODONE-acetaminophen (PERCOCET/ROXICET) 5-325 MG tablet Take 1-2 tablets by mouth every 4 (four) hours as needed for severe pain. 30 tablet 0 01/12/2016 at 1715  . pantoprazole (PROTONIX) 40 MG tablet Take 1 tablet by mouth daily.  2 Past Week at Unknown time  . Prenatal Vit-Fe Fumarate-FA (PRENATAL MULTIVITAMIN) TABS tablet Take 1 tablet by mouth daily at 12 noon.   01/12/2016 at Unknown time    Review of Systems  Constitutional: Negative for fever  and malaise/fatigue.  Eyes: Negative for blurred vision.       Neg - floaters  Cardiovascular: Positive for leg swelling (mild).  Gastrointestinal: Positive for nausea and abdominal pain (incisional). Negative for vomiting, diarrhea and constipation.  Neurological: Positive for headaches.   Physical Exam   Blood pressure 158/101, pulse 91, temperature 98.4 F (36.9 C), temperature source Oral, resp. rate 16, SpO2 98 %, currently breastfeeding.  Physical Exam  Nursing note and vitals reviewed. Constitutional: She is oriented to person, place, and time. She appears well-developed and well-nourished. No distress.   HENT:  Head: Normocephalic and atraumatic.  Cardiovascular: Normal rate.   Respiratory: Effort normal.  GI: Soft. She exhibits no distension and no mass. There is tenderness (mild RUQ abdominal tenderness to palpation). There is no rebound and no guarding.  Musculoskeletal: She exhibits edema (mild non-pitting edema of the LE bilaterally).  Neurological: She is alert and oriented to person, place, and time. She has normal reflexes.  No clonus  Skin: Skin is warm and dry. No erythema.  Psychiatric: She has a normal mood and affect.    Results for orders placed or performed during the hospital encounter of 01/12/16 (from the past 24 hour(s))  Urinalysis, Routine w reflex microscopic (not at [facility])     Status: Abnormal   Collection Time: 01/12/16  7:49 PM  Result Value Ref Range   Color, Urine YELLOW YELLOW   APPearance CLEAR CLEAR   Specific Gravity, Urine 1.010 1.005 - 1.030   pH 7.0 5.0 - 8.0   Glucose, UA NEGATIVE NEGATIVE mg/dL   Hgb urine dipstick LARGE (A) NEGATIVE   Bilirubin Urine NEGATIVE NEGATIVE   Ketones, ur NEGATIVE NEGATIVE mg/dL   Protein, ur 30 (A) NEGATIVE mg/dL   Nitrite NEGATIVE NEGATIVE   Leukocytes, UA SMALL (A) NEGATIVE  Protein / creatinine ratio, urine     Status: Abnormal   Collection Time: 01/12/16  7:49 PM  Result Value Ref Range   Creatinine, Urine 25.00 mg/dL   Total Protein, Urine 46 mg/dL   Protein Creatinine Ratio 1.84 (H) 0.00 - 0.15 mg/mg[Cre]  Urine microscopic-add on     Status: Abnormal   Collection Time: 01/12/16  7:49 PM  Result Value Ref Range   Squamous Epithelial / LPF 0-5 (A) NONE SEEN   WBC, UA 0-5 0 - 5 WBC/hpf   RBC / HPF 0-5 0 - 5 RBC/hpf   Bacteria, UA RARE (A) NONE SEEN  CBC with Differential/Platelet     Status: None   Collection Time: 01/12/16  8:15 PM  Result Value Ref Range   WBC 8.1 4.0 - 10.5 K/uL   RBC 3.93 3.87 - 5.11 MIL/uL   Hemoglobin 12.2 12.0 - 15.0 g/dL   HCT 45.4 09.8 - 11.9 %   MCV 91.6 78.0 - 100.0 fL    MCH 31.0 26.0 - 34.0 pg   MCHC 33.9 30.0 - 36.0 g/dL   RDW 14.7 82.9 - 56.2 %   Platelets 198 150 - 400 K/uL   Neutrophils Relative % 72 %   Neutro Abs 5.8 1.7 - 7.7 K/uL   Lymphocytes Relative 18 %   Lymphs Abs 1.4 0.7 - 4.0 K/uL   Monocytes Relative 7 %   Monocytes Absolute 0.6 0.1 - 1.0 K/uL   Eosinophils Relative 3 %   Eosinophils Absolute 0.2 0.0 - 0.7 K/uL   Basophils Relative 0 %   Basophils Absolute 0.0 0.0 - 0.1 K/uL  Comprehensive metabolic panel  Status: Abnormal   Collection Time: 01/12/16  8:15 PM  Result Value Ref Range   Sodium 136 135 - 145 mmol/L   Potassium 4.3 3.5 - 5.1 mmol/L   Chloride 106 101 - 111 mmol/L   CO2 24 22 - 32 mmol/L   Glucose, Bld 104 (H) 65 - 99 mg/dL   BUN 11 6 - 20 mg/dL   Creatinine, Ser 1.61 0.44 - 1.00 mg/dL   Calcium 8.7 (L) 8.9 - 10.3 mg/dL   Total Protein 6.3 (L) 6.5 - 8.1 g/dL   Albumin 2.7 (L) 3.5 - 5.0 g/dL   AST 53 (H) 15 - 41 U/L   ALT 56 (H) 14 - 54 U/L   Alkaline Phosphatase 121 38 - 126 U/L   Total Bilirubin 0.6 0.3 - 1.2 mg/dL   GFR calc non Af Amer >60 >60 mL/min   GFR calc Af Amer >60 >60 mL/min   Anion gap 6 5 - 15  Uric acid     Status: None   Collection Time: 01/12/16  8:15 PM  Result Value Ref Range   Uric Acid, Serum 6.2 2.3 - 6.6 mg/dL  Lactate dehydrogenase     Status: Abnormal   Collection Time: 01/12/16  8:15 PM  Result Value Ref Range   LDH 285 (H) 98 - 192 U/L    Patient Vitals for the past 24 hrs:  BP Temp Temp src Pulse Resp SpO2  01/12/16 2044 (!) 158/101 mmHg - - 91 - -  01/12/16 2030 (!) 174/111 mmHg - - 92 - -  01/12/16 2015 (!) 164/105 mmHg - - 92 - -  01/12/16 2005 165/100 mmHg - - 92 - -  01/12/16 1950 151/92 mmHg 98.4 F (36.9 C) Oral 94 16 98 %    MAU Course  Procedures None  MDM UA, CBC, CMP, Uric Acid, LDH, Urine Protein/Creatinine Ratio 100 mg Labetalol given Discussed patient with Dr. Henderson Cloud. Agrees with need for re-admission for IV Mag.  Assessment and Plan  A: PPD  #5 Pre-eclampsia  P: Admit to [facility] for Mag  Marny Lowenstein, PA-C  01/12/2016, 8:53 PM

## 2016-01-12 NOTE — MAU Note (Signed)
Pt presents with complaint of elevated B/P at home. S/P c-section on 01/26 for preeclampsia and was discharged yesterday. States she has had some nausea and a headache since she was dc'ed home. B/p at home today 175/100

## 2016-01-12 NOTE — MAU Note (Signed)
Urine in lab 

## 2016-01-12 NOTE — H&P (Signed)
History     CSN: [id number]  Arrival date and time: 01/12/16 1941   First Provider Initiated Contact with Patient 01/12/16 2007      Chief Complaint  Patient presents with  . Hypertension   HPI Ms. Jessica Wilkins is a 35 y.o. [id number] who presents to MAU today with complaint of elevated blood pressure. The patient was delivered by C/S on 01/07/16 for pre-eclampsia. The patient has been on Labetalol since October for tachycardia. She was initially taking 100 mg BID, but was reduced to 100 mg daily late in pregnancy due to hypotension. She developed elevated blood pressures late in pregnancy. Today she took BP at home and it was 175/100 prior to arrival in MAU. She states last Labetalol was taken this morning. She has had intermittent headache. Rates pain now at 3/10. She denied blurred vision, floaters, RUQ abdominal pain or significant peripheral edema. She has been taking Percocet and Motrin for incision pain. She denies fever. She is breastfeeding. Babies are in NICU for feeding issues.   OB History    Gravida Para Term Preterm AB TAB SAB Ectopic Multiple Living   Past Medical History  Diagnosis Date  . Hypertension     Past Surgical History  Procedure Laterality Date  . Cesarean section    . Cesarean section N/A 01/07/2016    Procedure: CESAREAN SECTION;  Surgeon: Candice Camp, MD;  Location: [facility];  Service: Obstetrics;  Laterality: N/A;    Family History  Problem Relation Age of Onset  . Stroke Paternal Grandmother   . Hypertension Paternal Grandmother   . Heart attack Neg Hx     Social History  Substance Use Topics  . Smoking status: Never Smoker   . Smokeless tobacco: None  . Alcohol Use: No    Allergies:  Allergies  Allergen Reactions  . Amoxicillin Hives  . Cephalexin Hives  . Penicillins Hives    Has patient had a PCN reaction causing immediate rash, facial/tongue/throat swelling, SOB or lightheadedness with hypotension: No Has  patient had a PCN reaction causing severe rash involving mucus membranes or skin necrosis: No Has patient had a PCN reaction that required hospitalization No Has patient had a PCN reaction occurring within the last 10 years: No If all of the above answers are "NO", then may proceed with Cephalosporin use.     Prescriptions prior to admission  Medication Sig Dispense Refill Last Dose  . acetaminophen (TYLENOL) 325 MG tablet Take 650 mg by mouth every 6 (six) hours as needed for mild pain.   Past Month at Unknown time  . ibuprofen (ADVIL,MOTRIN) 600 MG tablet Take 1 tablet (600 mg total) by mouth every 6 (six) hours. 30 tablet 1 01/12/2016 at 0700  . labetalol (NORMODYNE) 100 MG tablet Take 1 tablet (100 mg total) by mouth daily. (Patient taking differently: Take 100 mg by mouth 2 (two) times daily. ) 90 tablet 3 01/12/2016 at 0900  . oxyCODONE-acetaminophen (PERCOCET/ROXICET) 5-325 MG tablet Take 1-2 tablets by mouth every 4 (four) hours as needed for severe pain. 30 tablet 0 01/12/2016 at 1715  . pantoprazole (PROTONIX) 40 MG tablet Take 1 tablet by mouth daily.  2 Past Week at Unknown time  . Prenatal Vit-Fe Fumarate-FA (PRENATAL MULTIVITAMIN) TABS tablet Take 1 tablet by mouth daily at 12 noon.   01/12/2016 at Unknown time    Review of Systems  Constitutional: Negative for  fever and malaise/fatigue.  Eyes: Negative for blurred vision.       Neg - floaters  Cardiovascular: Positive for leg swelling (mild).  Gastrointestinal: Positive for nausea and abdominal pain (incisional). Negative for vomiting, diarrhea and constipation.  Neurological: Positive for headaches.   Physical Exam   Blood pressure 158/101, pulse 91, temperature 98.4 F (36.9 C), temperature source Oral, resp. rate 16, SpO2 98 %, currently breastfeeding.  Physical Exam  Nursing note and vitals reviewed. Constitutional: She is oriented to person, place, and time. She appears well-developed and well-nourished. No distress.   HENT:  Head: Normocephalic and atraumatic.  Cardiovascular: Normal rate.   Respiratory: Effort normal.  GI: Soft. She exhibits no distension and no mass. There is tenderness (mild RUQ abdominal tenderness to palpation). There is no rebound and no guarding.  Musculoskeletal: She exhibits edema (mild non-pitting edema of the LE bilaterally).  Neurological: She is alert and oriented to person, place, and time. She has normal reflexes.  No clonus  Skin: Skin is warm and dry. No erythema.  Psychiatric: She has a normal mood and affect.    Results for orders placed or performed during the hospital encounter of 01/12/16 (from the past 24 hour(s))  Urinalysis, Routine w reflex microscopic (not at [facility])     Status: Abnormal   Collection Time: 01/12/16  7:49 PM  Result Value Ref Range   Color, Urine YELLOW YELLOW   APPearance CLEAR CLEAR   Specific Gravity, Urine 1.010 1.005 - 1.030   pH 7.0 5.0 - 8.0   Glucose, UA NEGATIVE NEGATIVE mg/dL   Hgb urine dipstick LARGE (A) NEGATIVE   Bilirubin Urine NEGATIVE NEGATIVE   Ketones, ur NEGATIVE NEGATIVE mg/dL   Protein, ur 30 (A) NEGATIVE mg/dL   Nitrite NEGATIVE NEGATIVE   Leukocytes, UA SMALL (A) NEGATIVE  Protein / creatinine ratio, urine     Status: Abnormal   Collection Time: 01/12/16  7:49 PM  Result Value Ref Range   Creatinine, Urine 25.00 mg/dL   Total Protein, Urine 46 mg/dL   Protein Creatinine Ratio 1.84 (H) 0.00 - 0.15 mg/mg[Cre]  Urine microscopic-add on     Status: Abnormal   Collection Time: 01/12/16  7:49 PM  Result Value Ref Range   Squamous Epithelial / LPF 0-5 (A) NONE SEEN   WBC, UA 0-5 0 - 5 WBC/hpf   RBC / HPF 0-5 0 - 5 RBC/hpf   Bacteria, UA RARE (A) NONE SEEN  CBC with Differential/Platelet     Status: None   Collection Time: 01/12/16  8:15 PM  Result Value Ref Range   WBC 8.1 4.0 - 10.5 K/uL   RBC 3.93 3.87 - 5.11 MIL/uL   Hemoglobin 12.2 12.0 - 15.0 g/dL   HCT 16.1 09.6 - 04.5 %   MCV 91.6 78.0 - 100.0 fL    MCH 31.0 26.0 - 34.0 pg   MCHC 33.9 30.0 - 36.0 g/dL   RDW 40.9 81.1 - 91.4 %   Platelets 198 150 - 400 K/uL   Neutrophils Relative % 72 %   Neutro Abs 5.8 1.7 - 7.7 K/uL   Lymphocytes Relative 18 %   Lymphs Abs 1.4 0.7 - 4.0 K/uL   Monocytes Relative 7 %   Monocytes Absolute 0.6 0.1 - 1.0 K/uL   Eosinophils Relative 3 %   Eosinophils Absolute 0.2 0.0 - 0.7 K/uL   Basophils Relative 0 %   Basophils Absolute 0.0 0.0 - 0.1 K/uL  Comprehensive metabolic panel  Status: Abnormal   Collection Time: 01/12/16  8:15 PM  Result Value Ref Range   Sodium 136 135 - 145 mmol/L   Potassium 4.3 3.5 - 5.1 mmol/L   Chloride 106 101 - 111 mmol/L   CO2 24 22 - 32 mmol/L   Glucose, Bld 104 (H) 65 - 99 mg/dL   BUN 11 6 - 20 mg/dL   Creatinine, Ser 9.52 0.44 - 1.00 mg/dL   Calcium 8.7 (L) 8.9 - 10.3 mg/dL   Total Protein 6.3 (L) 6.5 - 8.1 g/dL   Albumin 2.7 (L) 3.5 - 5.0 g/dL   AST 53 (H) 15 - 41 U/L   ALT 56 (H) 14 - 54 U/L   Alkaline Phosphatase 121 38 - 126 U/L   Total Bilirubin 0.6 0.3 - 1.2 mg/dL   GFR calc non Af Amer >60 >60 mL/min   GFR calc Af Amer >60 >60 mL/min   Anion gap 6 5 - 15  Uric acid     Status: None   Collection Time: 01/12/16  8:15 PM  Result Value Ref Range   Uric Acid, Serum 6.2 2.3 - 6.6 mg/dL  Lactate dehydrogenase     Status: Abnormal   Collection Time: 01/12/16  8:15 PM  Result Value Ref Range   LDH 285 (H) 98 - 192 U/L    Patient Vitals for the past 24 hrs:  BP Temp Temp src Pulse Resp SpO2  01/12/16 2044 (!) 158/101 mmHg - - 91 - -  01/12/16 2030 (!) 174/111 mmHg - - 92 - -  01/12/16 2015 (!) 164/105 mmHg - - 92 - -  01/12/16 2005 165/100 mmHg - - 92 - -  01/12/16 1950 151/92 mmHg 98.4 F (36.9 C) Oral 94 16 98 %    MAU Course  Procedures None  MDM UA, CBC, CMP, Uric Acid, LDH, Urine Protein/Creatinine Ratio 100 mg Labetalol given Discussed patient with Dr. Henderson Cloud. Agrees with need for re-admission for IV Mag.  Assessment and Plan  A: PPD  #5 Pre-eclampsia  P: Admit to [facility] for Mag  Marny Lowenstein, PA-C  01/12/2016, 8:53 PM    01/11/17 2215 I personally examined and discussed plan of care with patient and reviewed her labs and meds Agree with above plan. Will begin labetalol  po BID and use IV labetalol as needed for marked elevated BPs Labs in am DL

## 2016-01-13 ENCOUNTER — Encounter (HOSPITAL_COMMUNITY): Admission: RE | Payer: Self-pay | Source: Ambulatory Visit

## 2016-01-13 ENCOUNTER — Inpatient Hospital Stay (HOSPITAL_COMMUNITY)
Admission: RE | Admit: 2016-01-13 | Payer: BLUE CROSS/BLUE SHIELD | Source: Ambulatory Visit | Admitting: Obstetrics & Gynecology

## 2016-01-13 LAB — COMPREHENSIVE METABOLIC PANEL
ALBUMIN: 2.6 g/dL — AB (ref 3.5–5.0)
ALT: 50 U/L (ref 14–54)
ANION GAP: 7 (ref 5–15)
AST: 43 U/L — ABNORMAL HIGH (ref 15–41)
Alkaline Phosphatase: 118 U/L (ref 38–126)
BUN: 10 mg/dL (ref 6–20)
CHLORIDE: 105 mmol/L (ref 101–111)
CO2: 23 mmol/L (ref 22–32)
CREATININE: 0.64 mg/dL (ref 0.44–1.00)
Calcium: 7.7 mg/dL — ABNORMAL LOW (ref 8.9–10.3)
GFR calc Af Amer: >60 mL/min (ref >60)
GFR calc non Af Amer: >60 mL/min (ref >60)
GLUCOSE: 112 mg/dL — AB (ref 65–99)
Potassium: 4.1 mmol/L (ref 3.5–5.1)
SODIUM: 135 mmol/L (ref 135–145)
Total Bilirubin: 0.8 mg/dL (ref 0.3–1.2)
Total Protein: 5.9 g/dL — ABNORMAL LOW (ref 6.5–8.1)

## 2016-01-13 LAB — URIC ACID: URIC ACID, SERUM: 6.1 mg/dL (ref 2.3–6.6)

## 2016-01-13 LAB — CBC
HCT: 35.2 % — ABNORMAL LOW (ref 36.0–46.0)
Hemoglobin: 12 g/dL (ref 12.0–15.0)
MCH: 31.3 pg (ref 26.0–34.0)
MCHC: 34.1 g/dL (ref 30.0–36.0)
MCV: 91.7 fL (ref 78.0–100.0)
PLATELETS: 211 10*3/uL (ref 150–400)
RBC: 3.84 MIL/uL — ABNORMAL LOW (ref 3.87–5.11)
RDW: 12.7 % (ref 11.5–15.5)
WBC: 8.1 10*3/uL (ref 4.0–10.5)

## 2016-01-13 SURGERY — Surgical Case
Anesthesia: Choice | Laterality: Bilateral

## 2016-01-13 NOTE — Progress Notes (Signed)
Pt resting.  No HA, n/v or abd pain.  BP improved - 140s/80-90s Gen - NAD Abd - soft, NT/ND  A/P:  Postpartum pre-e Continue mag x 24 hrs total Labetalol bid

## 2016-01-13 NOTE — Lactation Note (Signed)
This note was copied from the chart of [name]. Lactation Consultation Note  Patient Name: [name] Today's Date: 01/13/2016 Reason for consult: Follow-up assessment;NICU baby NICU twins, 6 days old. Mom states that babies may be ready for D/C tomorrow (01-14-16). Mom pumping when this LC entered the room. Mom states that she was feeling poorly yesterday d/t Magnesium Sulphate and did not pump for 18 hours. Mom states that she was pumping about 40 ml, and is now getting around 10 ml. Discussed supply and demand with mom and enc pumping regularly, every 2-3 hours for 8 times/24 hours--with a 5-6 hour stretch for sleeping. Mom states that she is feeling a little better today. Mom states that she did attempt to nurse yesterday, and reports that one baby is nursing better than the other. Enc mom to offer STS and attempts at breast as she and babies able. Mom aware of OP/BFSG and LC phone line assistance. Enc mom to call for assistance as needed.   Maternal Data    Feeding Feeding Type: Formula Nipple Type: Regular Length of feed: 20 min  LATCH Score/Interventions                      Lactation Tools Discussed/Used     Consult Status Consult Status: Follow-up Date: 01/14/16 Follow-up type: In-patient    Jessica Wilkins 01/13/2016, 3:11 PM    

## 2016-01-14 MED ORDER — LABETALOL HCL 200 MG PO TABS: 200.0000 mg | ORAL_TABLET | Freq: Two times a day (BID) | ORAL | Status: DC

## 2016-01-14 NOTE — Progress Notes (Signed)
Admission discussed with patient.  Preeclampsia hand  out was given to patient she understands what symptoms to be aware of.  She is eager for discharge because her twins are also being discharged home at this time.

## 2016-01-14 NOTE — Progress Notes (Signed)
Feeling good  Filed Vitals:   01/14/16 0400 01/14/16 0402  BP: 138/93 138/93  Pulse: 84 85  Temp: 98.3 F (36.8 C)   Resp: 18    Lungs CTA Cor RRR Abdomen no epigastric tenderness Incision OK  A/P: D/C home         Continue labetalol  bid          Instructions reviewed         FU office 4 days

## 2016-01-14 NOTE — Discharge Summary (Signed)
Physician Discharge Summary  Patient ID: GENNETT GARCIA MRN: [id number] DOB/AGE: Nov 16, 1981 35 y.o.  Admit date: 01/12/2016 Discharge date: 01/14/2016  Admission Diagnoses:Post Partum Preeclampsia  Discharge Diagnoses:  Active Problems:   Preeclampsia in postpartum period   Discharged Condition: good  Hospital Course: patient readmitted 5 days post partum with preeclampsia. Received magnesium sulfate and labetalol dose adjusted. Mild LFT elevation on admission that normalized. BP responded well to adjusted dose of labetalol.  Consults: None  Significant Diagnostic Studies: labs:  Results for orders placed or performed during the hospital encounter of 01/12/16 (from the past 72 hour(s))  Urinalysis, Routine w reflex microscopic (not at [facility])     Status: Abnormal   Collection Time: 01/12/16  7:49 PM  Result Value Ref Range   Color, Urine YELLOW YELLOW   APPearance CLEAR CLEAR   Specific Gravity, Urine 1.010 1.005 - 1.030   pH 7.0 5.0 - 8.0   Glucose, UA NEGATIVE NEGATIVE mg/dL   Hgb urine dipstick LARGE (A) NEGATIVE   Bilirubin Urine NEGATIVE NEGATIVE   Ketones, ur NEGATIVE NEGATIVE mg/dL   Protein, ur 30 (A) NEGATIVE mg/dL   Nitrite NEGATIVE NEGATIVE   Leukocytes, UA SMALL (A) NEGATIVE  Protein / creatinine ratio, urine     Status: Abnormal   Collection Time: 01/12/16  7:49 PM  Result Value Ref Range   Creatinine, Urine 25.00 mg/dL   Total Protein, Urine 46 mg/dL    Comment: NO NORMAL RANGE ESTABLISHED FOR THIS TEST   Protein Creatinine Ratio 1.84 (H) 0.00 - 0.15 mg/mg[Cre]  Urine microscopic-add on     Status: Abnormal   Collection Time: 01/12/16  7:49 PM  Result Value Ref Range   Squamous Epithelial / LPF 0-5 (A) NONE SEEN   WBC, UA 0-5 0 - 5 WBC/hpf   RBC / HPF 0-5 0 - 5 RBC/hpf   Bacteria, UA RARE (A) NONE SEEN  CBC with Differential/Platelet     Status: None   Collection Time: 01/12/16  8:15 PM  Result Value Ref Range   WBC 8.1 4.0 - 10.5 K/uL   RBC 3.93 3.87  - 5.11 MIL/uL   Hemoglobin 12.2 12.0 - 15.0 g/dL   HCT 36.0 36.0 - 46.0 %   MCV 91.6 78.0 - 100.0 fL   MCH 31.0 26.0 - 34.0 pg   MCHC 33.9 30.0 - 36.0 g/dL   RDW 12.7 11.5 - 15.5 %   Platelets 198 150 - 400 K/uL   Neutrophils Relative % 72 %   Neutro Abs 5.8 1.7 - 7.7 K/uL   Lymphocytes Relative 18 %   Lymphs Abs 1.4 0.7 - 4.0 K/uL   Monocytes Relative 7 %   Monocytes Absolute 0.6 0.1 - 1.0 K/uL   Eosinophils Relative 3 %   Eosinophils Absolute 0.2 0.0 - 0.7 K/uL   Basophils Relative 0 %   Basophils Absolute 0.0 0.0 - 0.1 K/uL  Comprehensive metabolic panel     Status: Abnormal   Collection Time: 01/12/16  8:15 PM  Result Value Ref Range   Sodium 136 135 - 145 mmol/L   Potassium 4.3 3.5 - 5.1 mmol/L   Chloride 106 101 - 111 mmol/L   CO2 24 22 - 32 mmol/L   Glucose, Bld 104 (H) 65 - 99 mg/dL   BUN 11 6 - 20 mg/dL   Creatinine, Ser 0.64 0.44 - 1.00 mg/dL   Calcium 8.7 (L) 8.9 - 10.3 mg/dL   Total Protein 6.3 (L) 6.5 - 8.1 g/dL  Albumin 2.7 (L) 3.5 - 5.0 g/dL   AST 53 (H) 15 - 41 U/L   ALT 56 (H) 14 - 54 U/L   Alkaline Phosphatase 121 38 - 126 U/L   Total Bilirubin 0.6 0.3 - 1.2 mg/dL   GFR calc non Af Amer >60 >60 mL/min   GFR calc Af Amer >60 >60 mL/min    Comment: (NOTE) The eGFR has been calculated using the CKD EPI equation. This calculation has not been validated in all clinical situations. eGFR's persistently <60 mL/min signify possible Chronic Kidney Disease.    Anion gap 6 5 - 15  Uric acid     Status: None   Collection Time: 01/12/16  8:15 PM  Result Value Ref Range   Uric Acid, Serum 6.2 2.3 - 6.6 mg/dL  Lactate dehydrogenase     Status: Abnormal   Collection Time: 01/12/16  8:15 PM  Result Value Ref Range   LDH 285 (H) 98 - 192 U/L  Uric acid     Status: None   Collection Time: 01/13/16  5:03 AM  Result Value Ref Range   Uric Acid, Serum 6.1 2.3 - 6.6 mg/dL  CBC     Status: Abnormal   Collection Time: 01/13/16  5:03 AM  Result Value Ref Range   WBC  8.1 4.0 - 10.5 K/uL   RBC 3.84 (L) 3.87 - 5.11 MIL/uL   Hemoglobin 12.0 12.0 - 15.0 g/dL   HCT 35.2 (L) 36.0 - 46.0 %   MCV 91.7 78.0 - 100.0 fL   MCH 31.3 26.0 - 34.0 pg   MCHC 34.1 30.0 - 36.0 g/dL   RDW 12.7 11.5 - 15.5 %   Platelets 211 150 - 400 K/uL  Comprehensive metabolic panel     Status: Abnormal   Collection Time: 01/13/16  5:03 AM  Result Value Ref Range   Sodium 135 135 - 145 mmol/L   Potassium 4.1 3.5 - 5.1 mmol/L   Chloride 105 101 - 111 mmol/L   CO2 23 22 - 32 mmol/L   Glucose, Bld 112 (H) 65 - 99 mg/dL   BUN 10 6 - 20 mg/dL   Creatinine, Ser 0.64 0.44 - 1.00 mg/dL   Calcium 7.7 (L) 8.9 - 10.3 mg/dL   Total Protein 5.9 (L) 6.5 - 8.1 g/dL   Albumin 2.6 (L) 3.5 - 5.0 g/dL   AST 43 (H) 15 - 41 U/L   ALT 50 14 - 54 U/L   Alkaline Phosphatase 118 38 - 126 U/L   Total Bilirubin 0.8 0.3 - 1.2 mg/dL   GFR calc non Af Amer >60 >60 mL/min   GFR calc Af Amer >60 >60 mL/min    Comment: (NOTE) The eGFR has been calculated using the CKD EPI equation. This calculation has not been validated in all clinical situations. eGFR's persistently <60 mL/min signify possible Chronic Kidney Disease.    Anion gap 7 5 - 15    Treatments: IV hydration and magnesium sulfate, labetalol  Discharge Exam: Blood pressure 138/93, pulse 85, temperature 98.3 F (36.8 C), temperature source Oral, resp. rate 18, height 5' 8" (1.727 m), weight 223 lb 14.4 oz (101.56 kg), SpO2 96 %, currently breastfeeding. General appearance: alert, cooperative and no distress GI: soft, non-tender; bowel sounds normal; no masses,  no organomegaly  Disposition: 01-Home or Self Care     Medication List    TAKE these medications        acetaminophen 325 MG tablet  Commonly known as:  TYLENOL  Take 650 mg by mouth every 6 (six) hours as needed for mild pain.     ibuprofen 600 MG tablet  Commonly known as:  ADVIL,MOTRIN  Take 1 tablet (600 mg total) by mouth every 6 (six) hours.     labetalol 200 MG  tablet  Commonly known as:  NORMODYNE  Take 1 tablet (200 mg total) by mouth 2 (two) times daily.     oxyCODONE-acetaminophen 5-325 MG tablet  Commonly known as:  PERCOCET/ROXICET  Take 1-2 tablets by mouth every 4 (four) hours as needed for severe pain.     pantoprazole 40 MG tablet  Commonly known as:  PROTONIX  Take 1 tablet by mouth daily.     prenatal multivitamin Tabs tablet  Take 1 tablet by mouth daily at 12 noon.         Signed: Benjiman Core E 01/14/2016, 8:47 AM

## 2016-03-03 ENCOUNTER — Ambulatory Visit: Payer: BLUE CROSS/BLUE SHIELD | Admitting: Cardiology

## 2016-04-06 ENCOUNTER — Encounter: Payer: Self-pay | Admitting: Cardiology

## 2016-04-06 ENCOUNTER — Ambulatory Visit (INDEPENDENT_AMBULATORY_CARE_PROVIDER_SITE_OTHER): Payer: BLUE CROSS/BLUE SHIELD | Admitting: Cardiology

## 2016-04-06 VITALS — BP 126/72 | HR 85 | Ht 68.0 in | Wt 200.0 lb

## 2016-04-06 DIAGNOSIS — R0600 Dyspnea, unspecified: Secondary | ICD-10-CM

## 2016-04-06 DIAGNOSIS — R0609 Other forms of dyspnea: Secondary | ICD-10-CM | POA: Diagnosis not present

## 2016-04-06 DIAGNOSIS — R000 Tachycardia, unspecified: Secondary | ICD-10-CM

## 2016-04-06 DIAGNOSIS — O1495 Unspecified pre-eclampsia, complicating the puerperium: Secondary | ICD-10-CM

## 2016-04-06 DIAGNOSIS — R002 Palpitations: Secondary | ICD-10-CM | POA: Insufficient documentation

## 2016-04-06 NOTE — Patient Instructions (Signed)
Medication Instructions:  Your physician recommends that you continue on your current medications as directed. Please refer to the Current Medication list given to you today.  Labwork: None ordered.  Testing/Procedures: None ordered.  Follow-Up: Your physician recommends that you schedule a follow-up appointment as needed.   Any Other Special Instructions Will Be Listed Below (If Applicable).     If you need a refill on your cardiac medications before your next appointment, please call your pharmacy.   

## 2016-04-06 NOTE — Progress Notes (Signed)
Patient ID: Jessica Wilkins, female   DOB: 1981/06/30, 35 y.o.   MRN: [id number]      Cardiology Office Note  Date:  04/06/2016   ID:  Jessica Wilkins, DOB 1981-06-23, MRN [id number]  PCP:  No PCP Per Patient  Cardiologist:  Lars Masson, MD   History of Present Illness: Jessica Wilkins is a 35 y.o. female who presents for evaluation of tachycardia and palpitations during pregnancy, She is currently pregnant with twins (31. Weeks), due date in February 2017, she has one prior child. No problem during the first pregnancy. During this one she started to experience profound fatigue, when her HR is checked she is frequently tachycardic. On three occassions now she wakes up with tachycardia. She had those episodes prior to the pregnancy, but now they are worse.  Her labs show normal Hb, no TSH checked.  NO syncope, orthopnea, PND, no LE edema.   She is coming after 2 months, she was started on labetalol for persistent sinus tachycardia on Holter monitor, her palpitations have significantly improved, she denies LE edema, orthopnea, PND, chest pain, syncope.  04/06/2016 - the patient is coming after 3 months, she is 3 months postpartum she has 2 healthy twin girls, she is not nursing. She feels Tasia Catchings, she is fully active back at work and has no symptoms of chest pain, dyspnea on exertion, low lower extremity edema, no palpitations, orthopnea. She has discontinued taking labetalol with no change in her symptoms. Her blood pressure has been always controlled.  Past Medical History  Diagnosis Date  . Hypertension    Past Surgical History  Procedure Laterality Date  . Cesarean section    . Cesarean section N/A 01/07/2016    Procedure: CESAREAN SECTION;  Surgeon: Candice Camp, MD;  Location: [facility];  Service: Obstetrics;  Laterality: N/A;    Current Outpatient Prescriptions  Medication Sig Dispense Refill  . Cyanocobalamin (B-12 PO) Take 1 tablet by mouth daily.     No current  facility-administered medications for this visit.   Allergies:   Amoxicillin; Cephalexin; and Penicillins   Social History:  The patient  reports that she has never smoked. She does not have any smokeless tobacco history on file. She reports that she does not drink alcohol or use illicit drugs.   Family History:  The patient's family history includes Hypertension in her paternal grandmother; Stroke in her paternal grandmother. There is no history of Heart attack.   ROS:  Please see the history of present illness.  ll other systems are reviewed and negative.   PHYSICAL EXAM: VS:  BP 126/72 mmHg  Pulse 85  Ht  (1.727 m)  Wt 200 lb (90.719 kg)  BMI 30.42 kg/m2 , BMI Body mass index is 30.42 kg/(m^2). GEN: Well nourished, well developed,  HEENT: normal Neck: no JVD, carotid bruits, or masses Cardiac: RRR; no murmurs, rubs, or gallops,no edema  Respiratory:  clear to auscultation bilaterally, normal work of breathing GI: soft, nontender, nondistended, + BS MS: no deformity or atrophy Skin: warm and dry, no rash Neuro:  Strength and sensation are intact Psych: euthymic mood, full affect  EKG:  Sinus tachycardia, otherwise normal ECG.   Recent Labs: 09/18/2015: TSH 1.541 01/13/2016: ALT 50; BUN 10; Creatinine, Ser 0.64; Hemoglobin 12.0; Platelets 211; Potassium 4.1; Sodium 135   Lipid Panel No results found for: CHOL, TRIG, HDL, CHOLHDL, VLDL, LDLCALC, LDLDIRECT   Wt Readings from Last 3 Encounters:  04/06/16 200 lb (90.719 kg)  01/13/16 223 lb 14.4 oz (101.56 kg)  01/10/16 236 lb 8 oz (107.276 kg)    TTE: 09/18/2015 Left ventricle: The cavity size was normal. Systolic function was normal. The estimated ejection fraction was in the range of 60% to 65%. Wall motion was normal; there were no regional wall motion abnormalities. - Aortic valve: Transvalvular velocity was within the normal range. There was no stenosis. There was no regurgitation. - Mitral valve:  Transvalvular velocity was within the normal range. There was no evidence for stenosis. There was no regurgitation. - Right ventricle: Systolic function was normal. - Atrial septum: No defect or patent foramen ovale was identified. - Inferior vena cava: The vessel was normal in size. The respirophasic diameter changes were in the normal range (>= 50%), consistent with normal central venous pressure.  EKG performed today 04/06/2016 shows normal sinus rhythm with ventricular rate of 85 bpm and is otherwise completely normal.   ASSESSMENT AND PLAN:  1. Palpitations - Secondary to persistent sinus tachycardia during pregnancy, with normal echocardiogram, this is completely resolved postpartum.  2. Persistent sinus tachycardia - 24 hour Holter, sinus rhythm to sinus tachycardia for 40 % of the monitoring time. Improved with labetalol 100 mg po at night, she cant tolerate in the morning as her BP is too low. Normal TSH. She is now postpartum his heart rate is normal off labetalol, she doesn't require any more treatment.  3. DOE, fatigue - normal echocardiogram , just related to pregnancy, this is completely resolved post pregnancy.  This young calcium female is completely asymptomatic with normal echocardiogram, we will discharge her from our clinic and follow up just as needed.  Signed, Lars MassonNELSON, Adriaan Maltese H, MD  04/06/2016 9:47 AM    [facility] [address], [place], [place]  [address] Phone: [phone]; Fax: [phone]

## 2017-01-18 IMAGING — US US MFM OB TRANSVAGINAL
1 series · 12 of 28 positions shown · non-contrast
Comparison: none

[Series 1: us mfm ob transvaginal · 170 acquisitions, 12 frames shown]
[im 7/170]
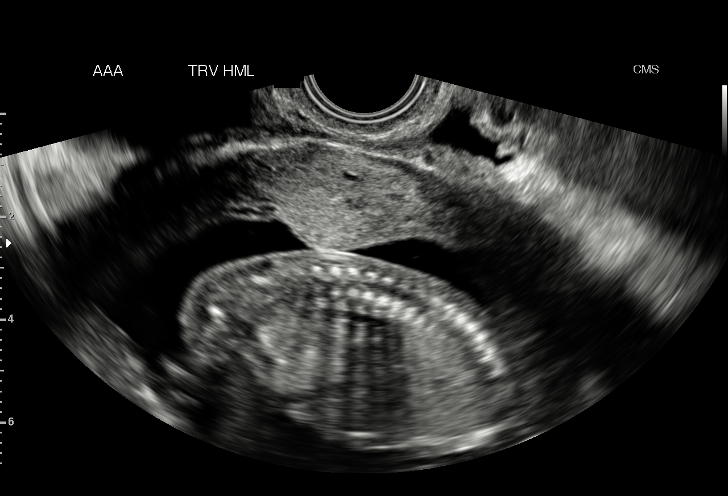
[im 19/170]
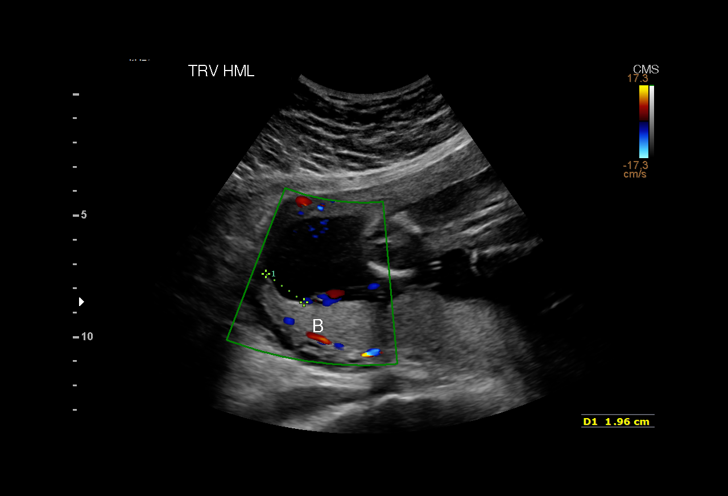
[im 32/170]
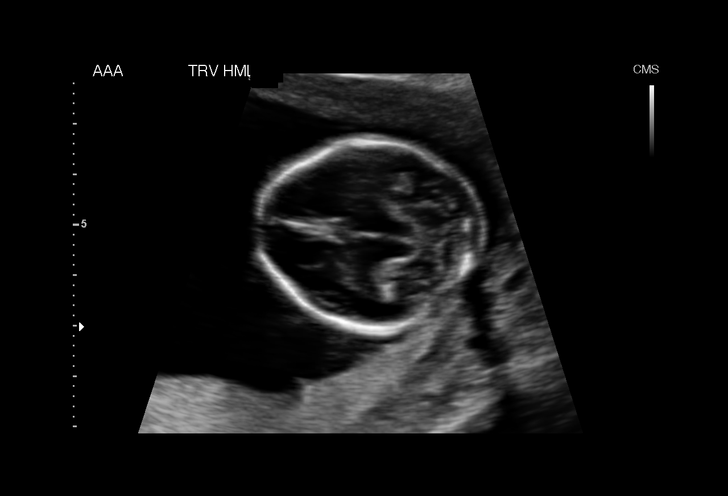
[im 51/170]
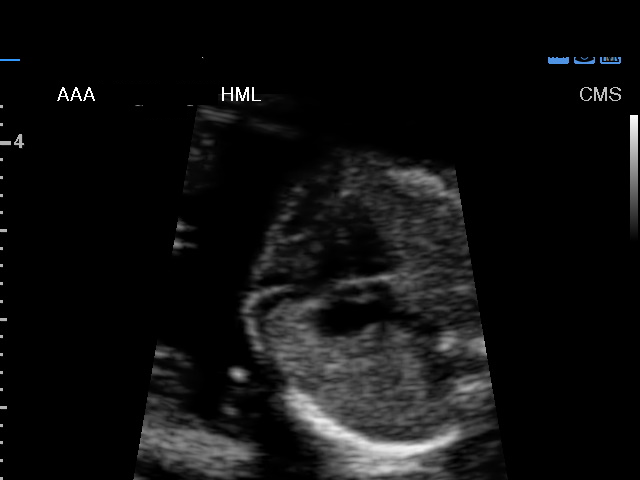
[im 63/170]
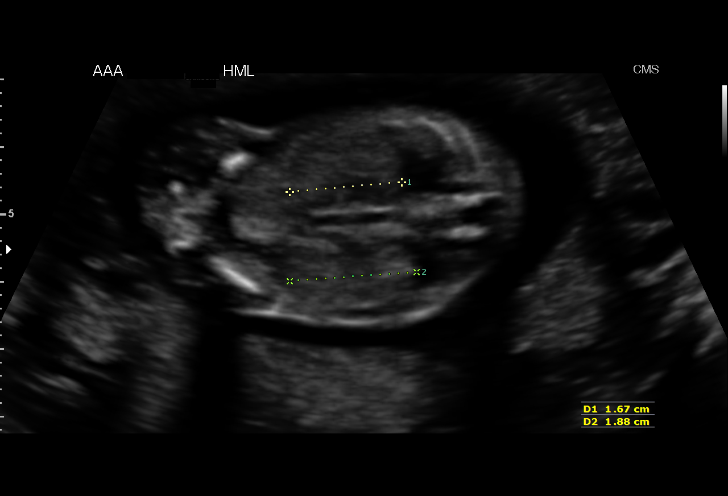
[im 76/170]
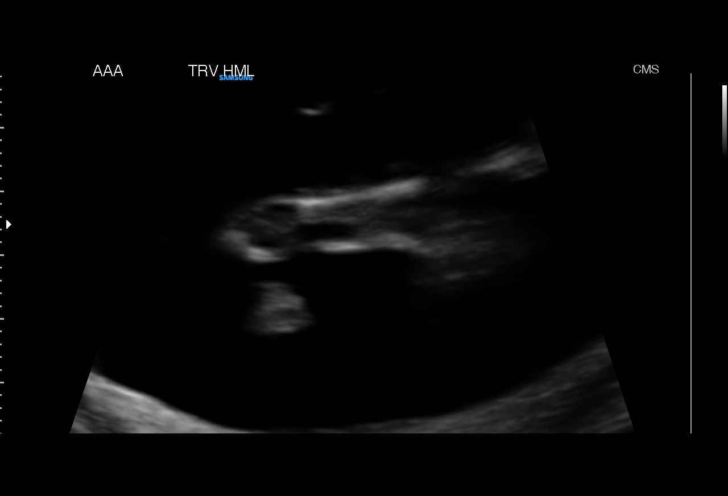
[im 94/170]
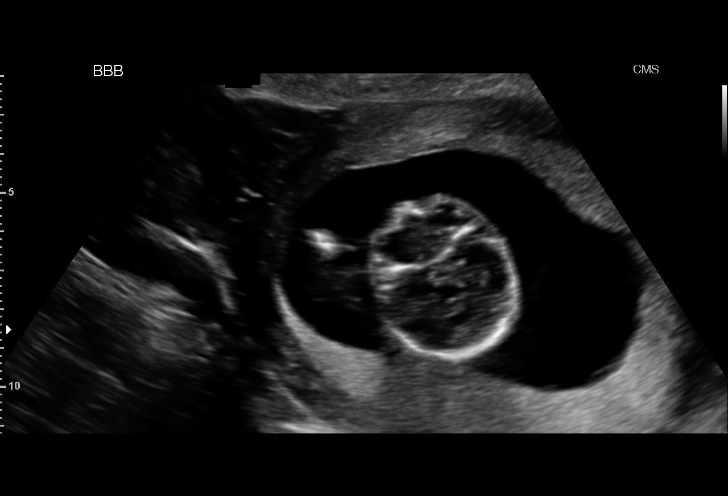
[im 107/170]
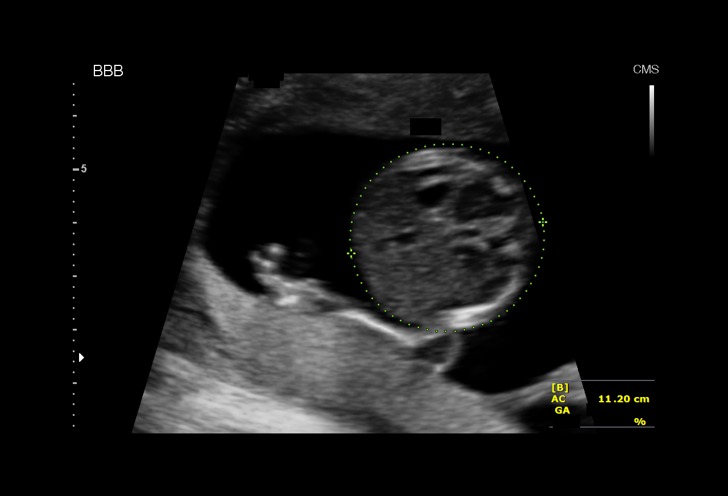
[im 119/170]
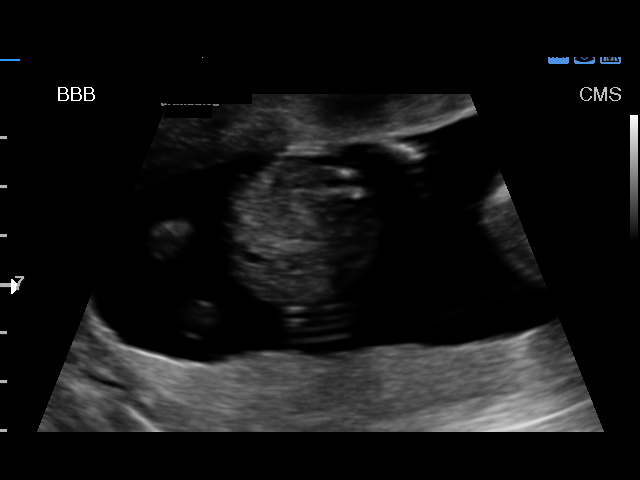
[im 138/170]
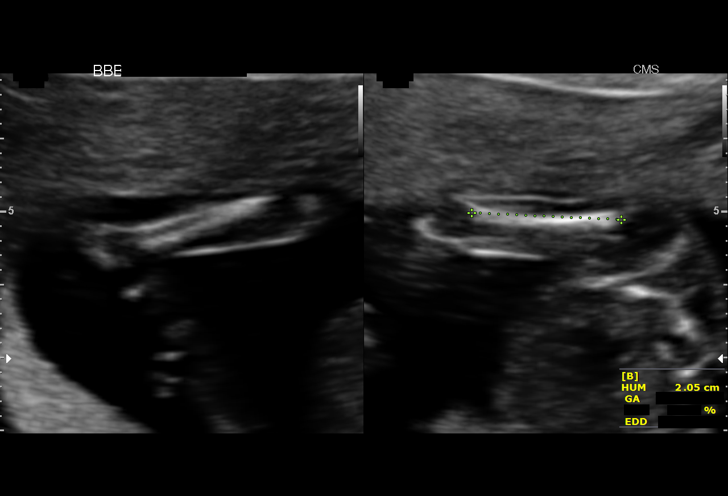
[im 151/170]
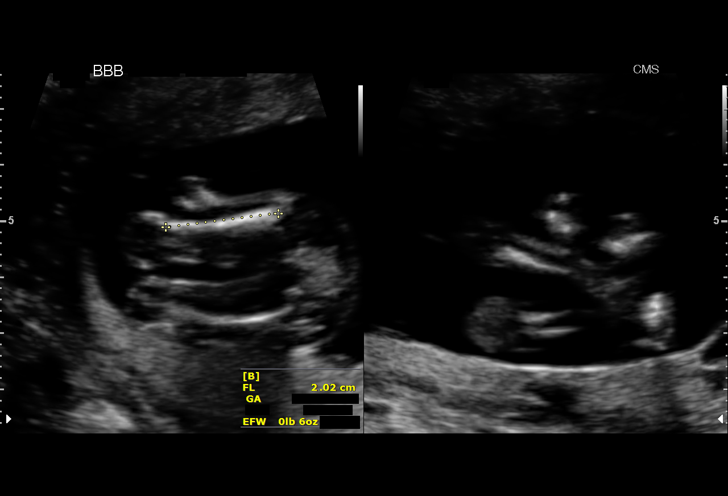
[im 163/170]
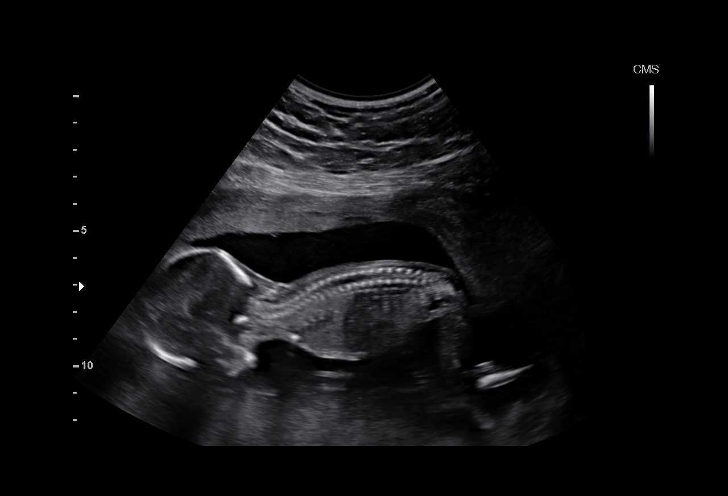

[12 of 28 positions shown; findings below may reference images not displayed]

OBSTETRICS REPORT

Service(s) Provided

US MFM OB TRANSVAGINAL                                76817.2
Indications

Detailed fetal anatomic survey                        Z36
Twin pregnancy, Agren/Kerber, second trimester
Advanced maternal age multigravida (34 by Lyss),
second trimester
Poor obstetric history: Previous gestational HTN
(ASA)
Previous cesarean delivery, antepartum
Fetal Evaluation (Fetus A)

Num Of Fetuses:    2
Fetal Heart Rate:  148                          bpm
Cardiac Activity:  Observed
Presentation:      Variable
Placenta:          Posterior Previa
P. Cord            Visualized, central
Insertion:

Amniotic Fluid
AFI FV:      Subjectively within normal limits
Larg Pckt:    5.08  cm
Biometry (Fetus A)

BPD:       36  mm     G. Age:  17w 0d                CI:        74.49   70 - 86
FL/HC:      16.5   14.6 -
17.6
HC:     132.4  mm     G. Age:  16w 6d       37  %    HC/AC:      1.09   1.07 -
1.29
AC:     121.6  mm     G. Age:  17w 6d       80  %    FL/BPD:
FL:      21.8  mm     G. Age:  16w 4d       33  %    FL/AC:      17.9   20 - 24
HUM:     20.3  mm     G. Age:  16w 0d       34  %
CER:     16.4  mm     G. Age:  16w 3d       40  %
NFT:      2.8  mm

Est. FW:     183  gm      0 lb 6 oz     63  %     FW Discordancy     0 \ 13 %
Gestational Age (Fetus A)
LMP:           16w 6d        Date:  04/28/15                 EDD:   02/02/16
U/S Today:     17w 1d                                        EDD:   01/31/16
Best:          16w 6d     Det. By:  LMP  (04/28/15)          EDD:   02/02/16
Anatomy (Fetus A)

Cranium:          Appears normal         Aortic Arch:      Not well visualized
Fetal Cavum:      Appears normal         Ductal Arch:      Not well visualized
Ventricles:       Appears normal         Diaphragm:        Appears normal
Choroid Plexus:   Appears normal         Stomach:          Appears normal, left
sided
Cerebellum:       Appears normal         Abdomen:          Appears normal
Posterior Fossa:  Appears normal         Abdominal Wall:   Appears nml (cord
insert, abd wall)
Nuchal Fold:      Appears normal         Cord Vessels:     Appears normal (3
vessel cord)
Face:             Appears normal         Kidneys:          Appear normal
(orbits and profile)
Lips:             Appears normal         Bladder:          Appears normal
Heart:            Appears normal         Spine:            Appears normal
(4CH, axis, and
situs)
RVOT:             Not well visualized    Lower             Appears normal
Extremities:
LVOT:             Appears normal         Upper             Appears normal
Extremities:

Other:  Female gender. Heels visualized. Technically difficult due to fetal
position.
Targeted Anatomy (Fetus A)

Fetal Central Nervous System
Cisterna Magna:

Fetal Evaluation (Fetus B)

Num Of Fetuses:    2
Fetal Heart Rate:  148                          bpm
Cardiac Activity:  Observed
Presentation:      Breech
Placenta:          Posterior Previa
P. Cord            Visualized, central
Insertion:

Amniotic Fluid
AFI FV:      Subjectively within normal limits
Larg Pckt:    4.03  cm
Biometry (Fetus B)

BPD:       35  mm     G. Age:  16w 6d                CI:         69.6   70 - 86
FL/HC:      14.9   14.6 -
17.6
HC:     133.9  mm     G. Age:  16w 6d       42  %    HC/AC:      1.20   1.07 -
1.29
AC:     111.3  mm     G. Age:  17w 0d       53  %    FL/BPD:
FL:      19.9  mm     G. Age:  16w 0d       14  %    FL/AC:      17.9   20 - 24
HUM:     20.5  mm     G. Age:  16w 1d       36  %
CER:       17  mm     G. Age:  17w 0d       53  %
NFT:        3  mm

Est. FW:     159  gm      0 lb 6 oz     50  %     FW Discordancy        13  %
Gestational Age (Fetus B)

LMP:           16w 6d        Date:  04/28/15                 EDD:   02/02/16
U/S Today:     16w 5d                                        EDD:   02/03/16
Best:          16w 6d     Det. By:  LMP  (04/28/15)          EDD:   02/02/16
Anatomy (Fetus B)

Cranium:          Appears normal         Aortic Arch:      Appears normal
Fetal Cavum:      Appears normal         Ductal Arch:      Appears normal
Ventricles:       Appears normal         Diaphragm:        Appears normal
Choroid Plexus:   Appears normal         Stomach:          Appears normal, left
sided
Cerebellum:       Appears normal         Abdomen:          Appears normal
Posterior Fossa:  Appears normal         Abdominal Wall:   Appears nml (cord
insert, abd wall)
Nuchal Fold:      Appears normal         Cord Vessels:     Appears normal (3
vessel cord)
Face:             Appears normal         Kidneys:          Appear normal
(orbits and profile)
Lips:             Appears normal         Bladder:          Appears normal
Heart:            Appears normal         Spine:            Appears normal
(4CH, axis, and
situs)
RVOT:             Appears normal         Lower             Appears normal
Extremities:
LVOT:             Appears normal         Upper             Appears normal
Extremities:

Other:  Female gender. Heels visualized. Technically difficult due to fetal
position.
Targeted Anatomy (Fetus B)

Fetal Central Nervous System
Cisterna Magna:
Cervix Uterus Adnexa

Cervical Length:    5.9      cm

Cervix:       Normal appearance by transvaginal scanNormal
appearance by transabdominal scan.
Uterus:       No abnormality visualized.
Cul De Sac:   No free fluid seen.

Left Ovary:    Not visualized.
Right Ovary:   Within normal limits.
Adnexa:     No abnormality visualized.
Impression

MC/DA twin gestation with best dates of 16w 6d
Fetal growth is concordant (13% inter-twin growth
discordance noted)

Twin A:
Variable presentation
Somewhat limited views of the fetal heart obtained
Normal amniotic fluid volume (MVP 5.1 cm).  Bladder
visualized.
Posterior placenta previa - the leading edge of the placenta
comes right to the internal os but does not cover it.

Twin B:
Breech
Normal fetal anatomic survey
Normal amniotic fluid volume (MVP 4.0 cm).  Bladder
visualized.
Posterior placenta
No evidence of TTTS
Recommendations

Recommend limited ultrasound in 2 weeks to screen for
TTTS.
Ultrasound for growth and to complete anatomy in 4 weeks.
Fetal echo at 20-22 weeks gestation

Attending Physician, CAMBIER

## 2017-02-02 IMAGING — US US MFM OB LIMITED
1 series · 14 of 28 positions shown · non-contrast
Comparison: none

[Series 1: us mfm ob limited · 14 of 46 slices shown]
[im 2/46]
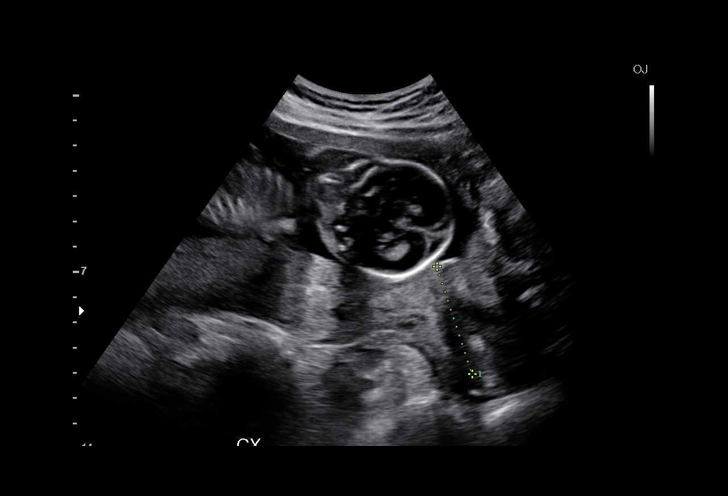
[im 6/46]
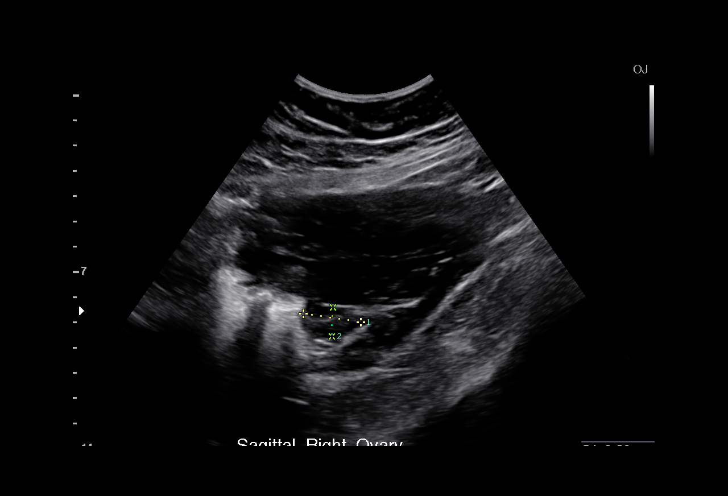
[im 9/46]
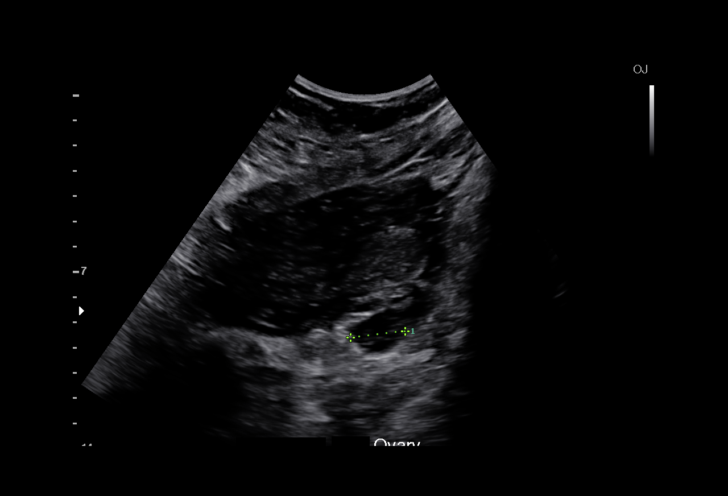
[im 12/46]
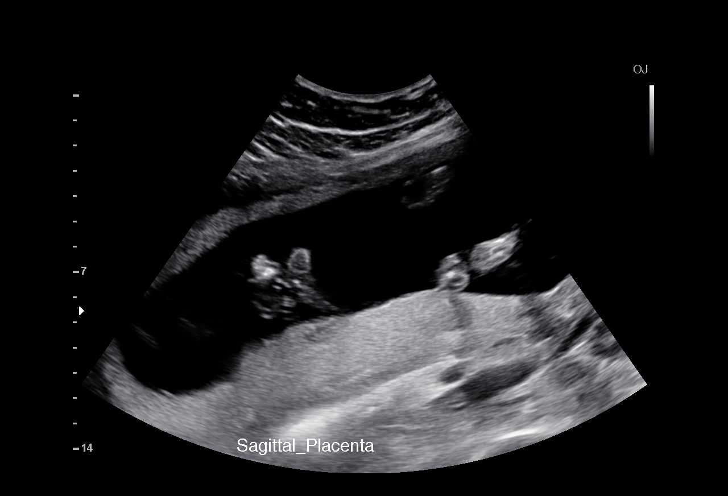
[im 16/46]
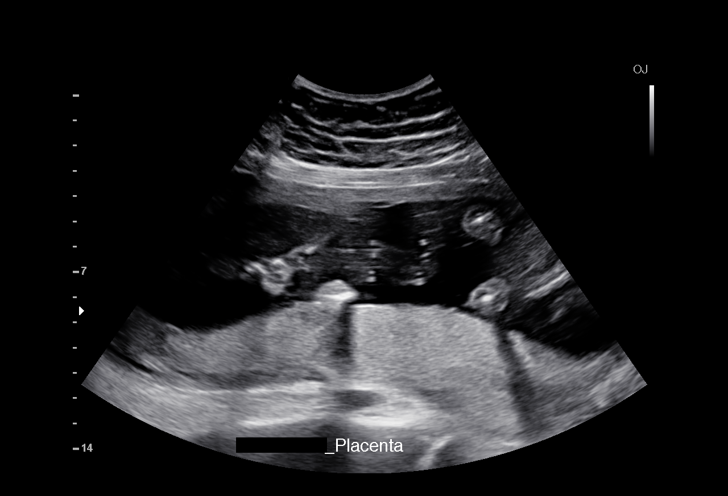
[im 19/46]
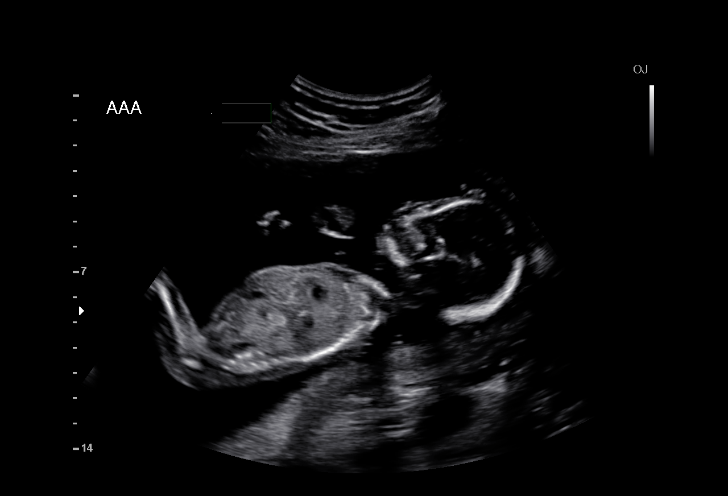
[im 22/46]
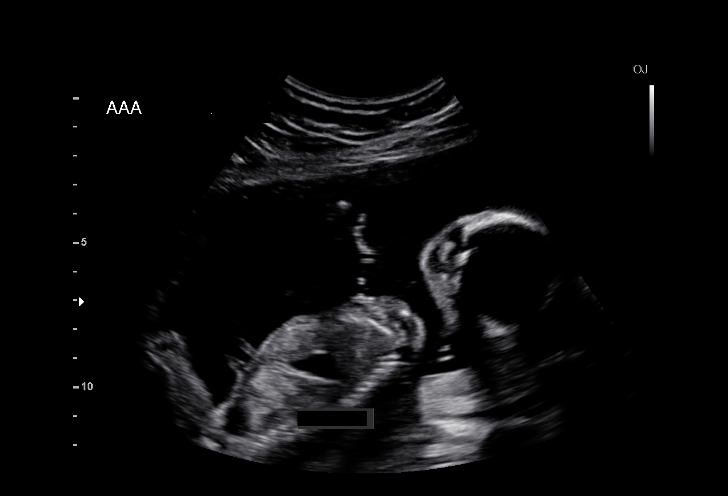
[im 26/46]
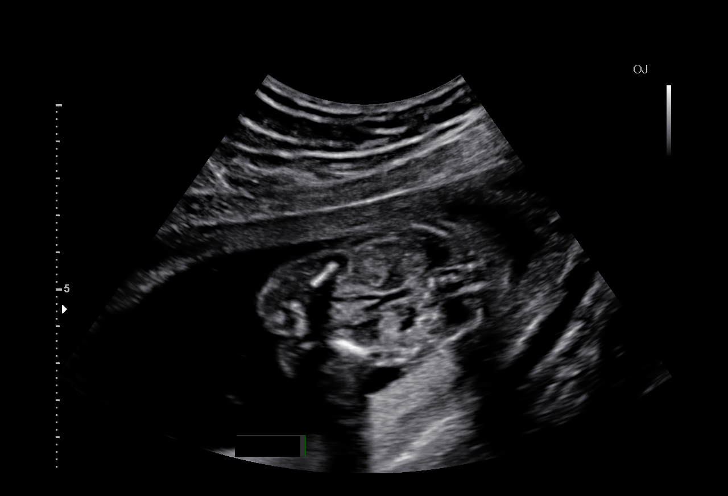
[im 29/46]
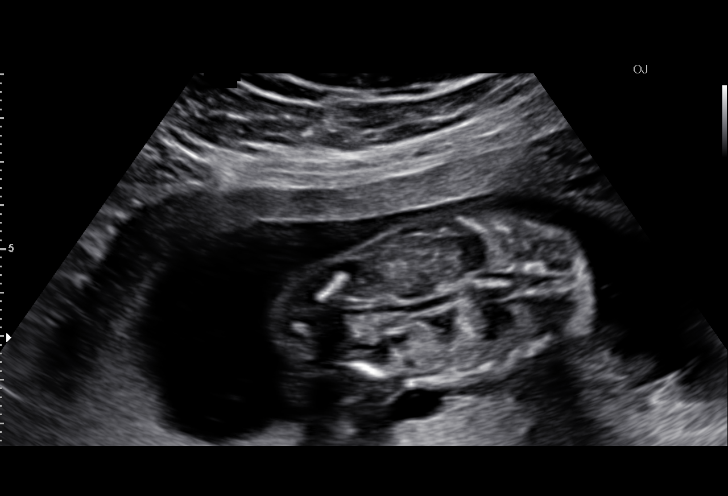
[im 32/46]
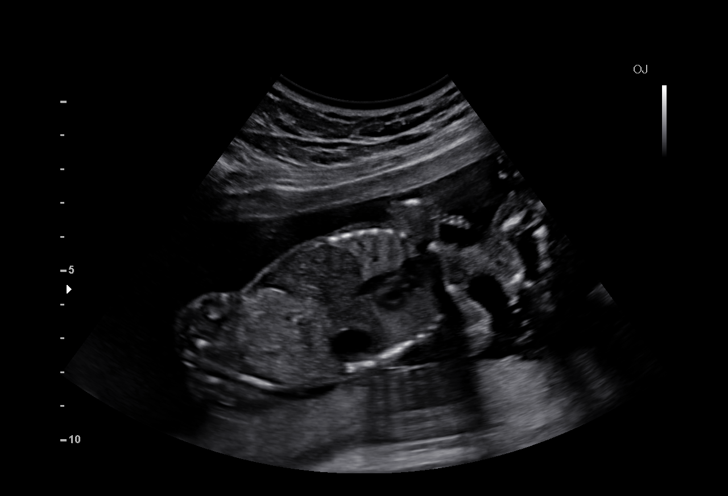
[im 36/46]
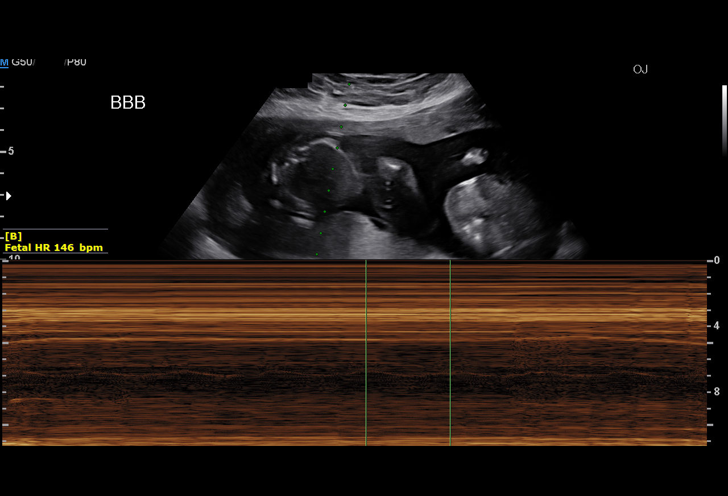
[im 39/46]
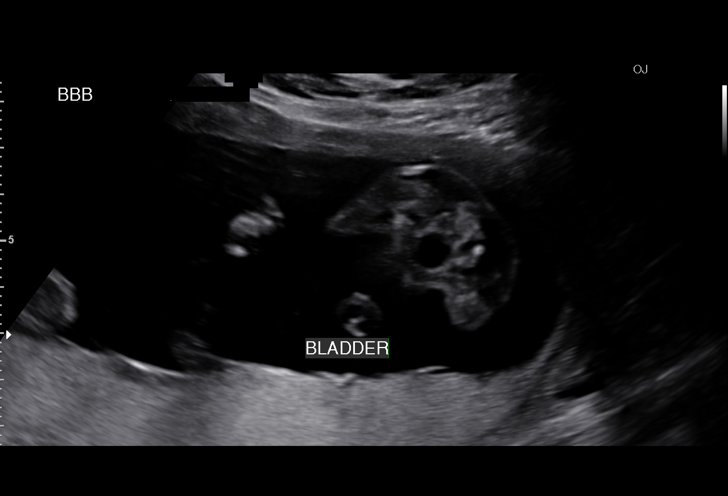
[im 42/46]
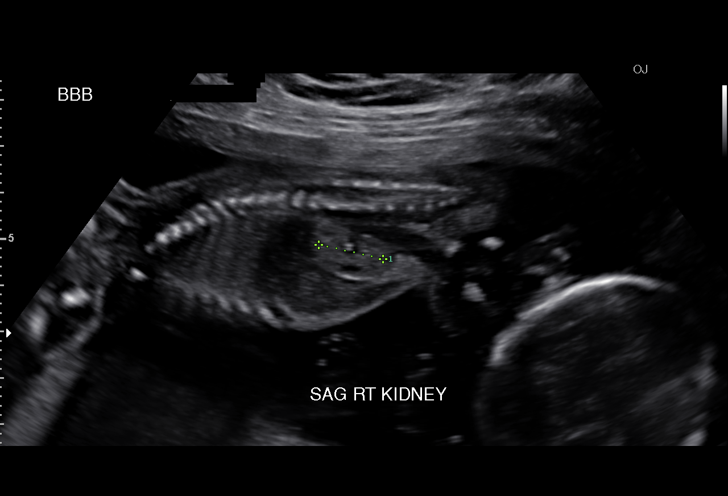
[im 46/46]
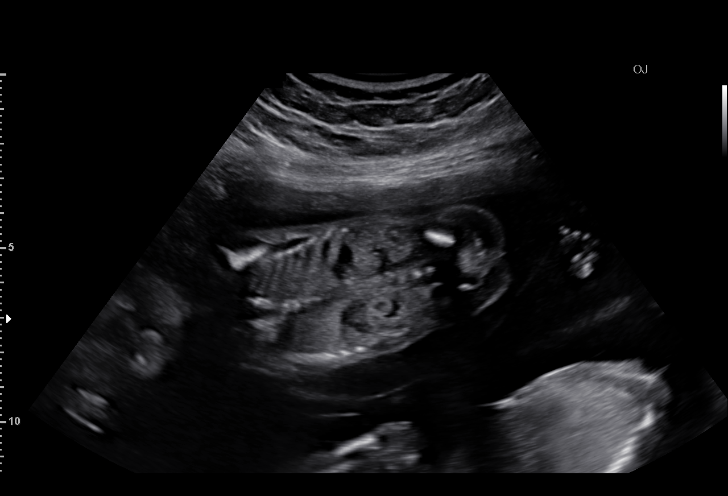

[14 of 28 positions shown; findings below may reference images not displayed]

OBSTETRICS REPORT
(Signed Final 09/08/2015 [DATE])

Name:       PRINCE ASARE BIGSON                      Visit  09/08/2015 [DATE]
Date:

By:
Service(s) Provided

US MFM OB LIMITED                                      76815.01
Indications

19 weeks gestation of pregnancy
Twin pregnancy, Mix/Robel Hadan, second trimester
Advanced maternal age multigravida (34 by Lop),
second trimester
Poor obstetric history: Previous gestational HTN
(ASA)
Previous cesarean delivery, antepartum
Fetal Evaluation (Fetus A)

Num Of             2
Fetuses:
Fetal Heart        146                          bpm
Rate:
Cardiac Activity:  Observed
Fetal Lie:         Lower Fetus
Presentation:      Cephalic
Placenta:          Low-lying,1.4cm from
int os
P. Cord            Visualized
Insertion:

Amniotic Fluid
AFI FV:      Subjectively within normal limits
Larg Pckt:      4.6  cm
Gestational Age (Fetus A)

LMP:           19w 0d        Date:  04/28/15                  EDD:   02/02/16
Best:          19w 0d    Det. By:   LMP  (04/28/15)           EDD:   02/02/16

Fetal Evaluation (Fetus B)

Num Of             2
Fetuses:
Fetal Heart        146                          bpm
Rate:
Cardiac Activity:  Observed
Fetal Lie:         Upper Fetus
Presentation:      Breech
Placenta:          Low-lying, 1.4cm from
int os
P. Cord            Visualized
Insertion:

Amniotic Fluid
AFI FV:      Subjectively within normal limits
Larg Pckt:      3.2  cm
Gestational Age (Fetus B)

LMP:           19w 0d        Date:  04/28/15                  EDD:   02/02/16
Best:          19w 0d    Det. By:   LMP  (04/28/15)           EDD:   02/02/16
Cervix Uterus Adnexa

Cervical Length:    4.6       cm

Cervix:       Normal appearance by transabdominal scan.

Left Ovary:    Within normal limits.
Right Ovary:   Within normal limits.
Impression

MC/DA twin gestation with best dates of 19w 0d
Limited ultrasound performed to screen for TTTS

Twin A:
Cephalic presentation
Normal amniotic fluid volume (MVP 4.6 cm).  Bladder
visualized.
Low lying placenta - the leading edge of the placenta is
approximately 1 cm from the internal os

Twin B:
Breech
Normal amniotic fluid volume (MVP 3.2 cm).  Bladder
visualized.
Posterior placenta

No evidence of TTTS

---------------------------------------------------------------------- Recommendations

Recommend follow up in 2 weeks for interval growth
Limited ultrasound in 4 weeks to screen for TTTS

## 2017-04-13 ENCOUNTER — Encounter (HOSPITAL_COMMUNITY): Payer: Self-pay | Admitting: Obstetrics and Gynecology

## 2018-01-04 DIAGNOSIS — F4010 Social phobia, unspecified: Secondary | ICD-10-CM | POA: Diagnosis not present

## 2018-05-15 DIAGNOSIS — Z01419 Encounter for gynecological examination (general) (routine) without abnormal findings: Secondary | ICD-10-CM | POA: Diagnosis not present

## 2018-05-15 DIAGNOSIS — Z6827 Body mass index (BMI) 27.0-27.9, adult: Secondary | ICD-10-CM | POA: Diagnosis not present

## 2018-05-15 DIAGNOSIS — R232 Flushing: Secondary | ICD-10-CM | POA: Diagnosis not present

## 2018-05-15 DIAGNOSIS — R87610 Atypical squamous cells of undetermined significance on cytologic smear of cervix (ASC-US): Secondary | ICD-10-CM | POA: Diagnosis not present

## 2018-05-15 DIAGNOSIS — Z113 Encounter for screening for infections with a predominantly sexual mode of transmission: Secondary | ICD-10-CM | POA: Diagnosis not present

## 2018-06-28 DIAGNOSIS — F411 Generalized anxiety disorder: Secondary | ICD-10-CM | POA: Diagnosis not present

## 2018-12-20 DIAGNOSIS — F4010 Social phobia, unspecified: Secondary | ICD-10-CM | POA: Diagnosis not present

## 2019-03-21 DIAGNOSIS — S339XXA Sprain of unspecified parts of lumbar spine and pelvis, initial encounter: Secondary | ICD-10-CM | POA: Diagnosis not present

## 2019-06-13 DIAGNOSIS — F4010 Social phobia, unspecified: Secondary | ICD-10-CM | POA: Diagnosis not present

## 2019-08-07 DIAGNOSIS — Z01419 Encounter for gynecological examination (general) (routine) without abnormal findings: Secondary | ICD-10-CM | POA: Diagnosis not present

## 2019-08-07 DIAGNOSIS — Z6829 Body mass index (BMI) 29.0-29.9, adult: Secondary | ICD-10-CM | POA: Diagnosis not present

## 2019-08-07 DIAGNOSIS — R000 Tachycardia, unspecified: Secondary | ICD-10-CM | POA: Diagnosis not present

## 2019-08-07 DIAGNOSIS — N870 Mild cervical dysplasia: Secondary | ICD-10-CM | POA: Diagnosis not present

## 2019-10-31 DIAGNOSIS — R002 Palpitations: Secondary | ICD-10-CM | POA: Diagnosis not present

## 2019-10-31 DIAGNOSIS — Z23 Encounter for immunization: Secondary | ICD-10-CM | POA: Diagnosis not present

## 2019-10-31 DIAGNOSIS — Z0000 Encounter for general adult medical examination without abnormal findings: Secondary | ICD-10-CM | POA: Diagnosis not present

## 2019-11-19 DIAGNOSIS — Z13220 Encounter for screening for lipoid disorders: Secondary | ICD-10-CM | POA: Diagnosis not present

## 2019-11-19 DIAGNOSIS — R002 Palpitations: Secondary | ICD-10-CM | POA: Diagnosis not present

## 2019-11-19 DIAGNOSIS — Z0000 Encounter for general adult medical examination without abnormal findings: Secondary | ICD-10-CM | POA: Diagnosis not present

## 2019-11-20 NOTE — Progress Notes (Signed)
Cardiology Office Note:    Date:  11/21/2019   ID:  Jessica Wilkins, DOB 1981/10/16, MRN [id number]  PCP:  Patient, No Pcp Per  Cardiologist:  No primary care provider on file.  Electrophysiologist:  None   Referring MD: Juliet Rude*   Chief Complaint  Patient presents with  . Palpitations    History of Present Illness:    Jessica Wilkins is a 38 y.o. female with a hx of hypertension who is referred by Danella Penton, PA-C for evaluation of palpitations.  She previously followed with Dr. Delton See, last seen in 2017.  She had persistent sinus tachycardia during pregnancy and was started on labetalol.  Symptoms improved, and she discontinued labetalol after delivery.  She reports that since her delivery she has been having episodes of tachycardia once or twice per year.  Recently has been occurring more frequently, now occurring about once per week.  During these episodes she feels like her heart is racing.  Typically last around 2 minutes, but can last up to an hour.  Denies any lightheadedness or syncope.  No clear triggers.  Has not noted a relationship with exertion.  Reports she does yoga and walks for exercise, denies any exertional chest pain or dyspnea.  She smoked for 8 years, quit smoking 15 years ago.  No family history of heart disease in her immediate family.   Past Medical History:  Diagnosis Date  . Hypertension     Past Surgical History:  Procedure Laterality Date  . CESAREAN SECTION    . CESAREAN SECTION MULTI-GESTATIONAL N/A 01/07/2016   Procedure: CESAREAN SECTION MULTI-GESTATIONAL;  Surgeon: Candice Camp, MD;  Location: [facility];  Service: Obstetrics;  Laterality: N/A;    Current Medications: No outpatient medications have been marked as taking for the 11/21/19 encounter (Office Visit) with Little Ishikawa, MD.     Allergies:   Amoxicillin, Cephalexin, and Penicillins   Social History   Socioeconomic History  . Marital status: Married    Spouse name: Not on file  . Number of children: Not on file  . Years of education: Not on file  . Highest education level: Not on file  Occupational History  . Not on file  Tobacco Use  . Smoking status: Never Smoker  Substance and Sexual Activity  . Alcohol use: No  . Drug use: No  . Sexual activity: Yes    Birth control/protection: None  Other Topics Concern  . Not on file  Social History Narrative  . Not on file   Social Determinants of Health   Financial Resource Strain:   . Difficulty of Paying Living Expenses: Not on file  Food Insecurity:   . Worried About Programme researcher, broadcasting/film/video in the Last Year: Not on file  . Ran Out of Food in the Last Year: Not on file  Transportation Needs:   . Lack of Transportation (Medical): Not on file  . Lack of Transportation (Non-Medical): Not on file  Physical Activity:   . Days of Exercise per Week: Not on file  . Minutes of Exercise per Session: Not on file  Stress:   . Feeling of Stress : Not on file  Social Connections:   . Frequency of Communication with Friends and Family: Not on file  . Frequency of Social Gatherings with Friends and Family: Not on file  . Attends Religious Services: Not on file  . Active Member of Clubs or Organizations: Not on file  . Attends Club or  Organization Meetings: Not on file  . Marital Status: Not on file     Family History: The patient's family history includes Hypertension in her paternal grandmother; Stroke in her paternal grandmother. There is no history of Heart attack.  ROS:   Please see the history of present illness.     All other systems reviewed and are negative.  EKGs/Labs/Other Studies Reviewed:    The following studies were reviewed today:   EKG:  EKG is  ordered today.  The ekg ordered today demonstrates normal sinus rhythm, rate 72, no ST/T abnormalities  Holter monitor 09/28/15:  Sinus rhythm to sinus tachycardia for 40 % of the monitoring time.     TTE 09/28/15: -  Left ventricle: The cavity size was normal. Systolic function was   normal. The estimated ejection fraction was in the range of 60%   to 65%. Wall motion was normal; there were no regional wall   motion abnormalities. - Aortic valve: Transvalvular velocity was within the normal range.   There was no stenosis. There was no regurgitation. - Mitral valve: Transvalvular velocity was within the normal range.   There was no evidence for stenosis. There was no regurgitation. - Right ventricle: Systolic function was normal. - Atrial septum: No defect or patent foramen ovale was identified. - Inferior vena cava: The vessel was normal in size. The   respirophasic diameter changes were in the normal range (>= 50%),   consistent with normal central venous pressure.  Recent Labs: No results found for requested labs within last 8760 hours.  Recent Lipid Panel No results found for: CHOL, TRIG, HDL, CHOLHDL, VLDL, LDLCALC, LDLDIRECT  Physical Exam:    VS:  BP 126/89   Pulse 72   Ht 5\' 9"  (1.753 m)   Wt 194 lb 9.6 oz (88.3 kg)   BMI 28.74 kg/m     Wt Readings from Last 3 Encounters:  11/21/19 194 lb 9.6 oz (88.3 kg)  04/06/16 200 lb (90.7 kg)  01/13/16 223 lb 14.4 oz (101.6 kg)     GEN:  Well nourished, well developed in no acute distress HEENT: Normal NECK: No JVD LYMPHATICS: No lymphadenopathy CARDIAC: RRR, no murmurs, rubs, gallops RESPIRATORY:  Clear to auscultation without rales, wheezing or rhonchi  ABDOMEN: Soft, non-tender, non-distended MUSCULOSKELETAL:  No edema; No deformity  SKIN: Warm and dry NEUROLOGIC:  Alert and oriented x 3 PSYCHIATRIC:  Normal affect   ASSESSMENT:    1. Palpitations    PLAN:    In order of problems listed above:  Palpitations: Description concerning for arrhythmia, possibly SVT.  TTE in 2016 showed no structural abnormalities.  Will check Zio patch x2 weeks.  RTC in 3 months  Medication Adjustments/Labs and Tests Ordered: Current medicines  are reviewed at length with the patient today.  Concerns regarding medicines are outlined above.  Orders Placed This Encounter  Procedures  . LONG TERM MONITOR (3-14 DAYS)  . EKG 12-Lead   No orders of the defined types were placed in this encounter.   Patient Instructions  Medication Instructions:  Your physician recommends that you continue on your current medications as directed. Please refer to the Current Medication list given to you today.  *If you need a refill on your cardiac medications before your next appointment, please call your pharmacy*  Lab Work: NONE   Testing/Procedures:  2 WEEK ZIO WEEK   Follow-Up: At [facility], you and your health needs are our priority.  As part of our continuing mission  to provide you with exceptional heart care, we have created designated Provider Care Teams.  These Care Teams include your primary Cardiologist (physician) and Advanced Practice Providers (APPs -  Physician Assistants and Nurse Practitioners) who all work together to provide you with the care you need, when you need it.  Your next appointment:   3 month(s)  The format for your next appointment:   In Person  Provider:   You may see DR Gardiner Rhyme  or one of the following Advanced Practice Providers on your designated Care Team:    Rosaria Ferries, PA-C  Jory Sims, DNP, ANP  Cadence Kathlen Mody, NP   Other Instructions  Bryn Gulling- Long Term Monitor Instructions   Your physician has requested you wear your ZIO patch monitor_______days.   This is a single patch monitor.  [employer] supplies one patch monitor per enrollment.  Additional stickers are not available.   Please do not apply patch if you will be having a Nuclear Stress Test, Echocardiogram, Cardiac CT, MRI, or Chest Xray during the time frame you would be wearing the monitor. The patch cannot be worn during these tests.  You cannot remove and re-apply the ZIO XT patch monitor.   Your ZIO patch monitor will be  sent USPS Priority mail from [facility] directly to your home address. The monitor may also be mailed to a PO BOX if home delivery is not available.   It may take 3-5 days to receive your monitor after you have been enrolled.   Once you have received you monitor, please review enclosed instructions.  Your monitor has already been registered assigning a specific monitor serial # to you.   Applying the monitor   Shave hair from upper left chest.   Hold abrader disc by orange tab.  Rub abrader in 40 strokes over left upper chest as indicated in your monitor instructions.   Clean area with 4 enclosed alcohol pads .  Use all pads to assure are is cleaned thoroughly.  Let dry.   Apply patch as indicated in monitor instructions.  Patch will be place under collarbone on left side of chest with arrow pointing upward.   Rub patch adhesive wings for 2 minutes.Remove white label marked "1".  Remove white label marked "2".  Rub patch adhesive wings for 2 additional minutes.   While looking in a mirror, press and release button in center of patch.  A small green light will flash 3-4 times .  This will be your only indicator the monitor has been turned on.     Do not shower for the first 24 hours.  You may shower after the first 24 hours.   Press button if you feel a symptom. You will hear a small click.  Record Date, Time and Symptom in the Patient Log Book.   When you are ready to remove patch, follow instructions on last 2 pages of Patient Log Book.  Stick patch monitor onto last page of Patient Log Book.   Place Patient Log Book in [place] box.  Use locking tab on box and tape box closed securely.  The [employer] has [employer] on it.  Please place in mailbox as soon as possible.  Your physician should have your test results approximately 7 days after the monitor has been mailed back to [facility].   Call [place] at [phone] if you have  questions regarding your ZIO XT patch monitor.  Call them immediately if  you see an orange light blinking on your monitor.   If your monitor falls off in less than 4 days contact our Monitor department at [phone].  If your monitor becomes loose or falls off after 4 days call [employer] at [phone] for suggestions on securing your monitor.      Signed, Little Ishikawa, MD  11/21/2019 9:47 AM    [facility]

## 2019-11-21 ENCOUNTER — Telehealth: Payer: Self-pay | Admitting: Radiology

## 2019-11-21 ENCOUNTER — Ambulatory Visit (INDEPENDENT_AMBULATORY_CARE_PROVIDER_SITE_OTHER): Payer: BC Managed Care – PPO | Admitting: Cardiology

## 2019-11-21 ENCOUNTER — Other Ambulatory Visit: Payer: Self-pay

## 2019-11-21 VITALS — BP 126/89 | HR 72 | Ht 69.0 in | Wt 194.6 lb

## 2019-11-21 DIAGNOSIS — R002 Palpitations: Secondary | ICD-10-CM | POA: Diagnosis not present

## 2019-11-21 NOTE — Patient Instructions (Signed)
Medication Instructions:  Your physician recommends that you continue on your current medications as directed. Please refer to the Current Medication list given to you today.  *If you need a refill on your cardiac medications before your next appointment, please call your pharmacy*  Lab Work: NONE   Testing/Procedures:  2 WEEK ZIO WEEK   Follow-Up: At [facility], you and your health needs are our priority.  As part of our continuing mission to provide you with exceptional heart care, we have created designated Provider Care Teams.  These Care Teams include your primary Cardiologist (physician) and Advanced Practice Providers (APPs -  Physician Assistants and Nurse Practitioners) who all work together to provide you with the care you need, when you need it.  Your next appointment:   3 month(s)  The format for your next appointment:   In Person  Provider:   You may see DR Bjorn Pippin  or one of the following Advanced Practice Providers on your designated Care Team:    Theodore Demark, PA-C  Joni Reining, DNP, ANP  Cadence Fransico Michael, NP   Other Instructions  Jessica Wilkins- Long Term Monitor Instructions   Your physician has requested you wear your ZIO patch monitor_______days.   This is a single patch monitor.  [employer] supplies one patch monitor per enrollment.  Additional stickers are not available.   Please do not apply patch if you will be having a Nuclear Stress Test, Echocardiogram, Cardiac CT, MRI, or Chest Xray during the time frame you would be wearing the monitor. The patch cannot be worn during these tests.  You cannot remove and re-apply the ZIO XT patch monitor.   Your ZIO patch monitor will be sent USPS Priority mail from [facility] directly to your home address. The monitor may also be mailed to a PO BOX if home delivery is not available.   It may take 3-5 days to receive your monitor after you have been enrolled.   Once you have received you monitor, please  review enclosed instructions.  Your monitor has already been registered assigning a specific monitor serial # to you.   Applying the monitor   Shave hair from upper left chest.   Hold abrader disc by orange tab.  Rub abrader in 40 strokes over left upper chest as indicated in your monitor instructions.   Clean area with 4 enclosed alcohol pads .  Use all pads to assure are is cleaned thoroughly.  Let dry.   Apply patch as indicated in monitor instructions.  Patch will be place under collarbone on left side of chest with arrow pointing upward.   Rub patch adhesive wings for 2 minutes.Remove white label marked "1".  Remove white label marked "2".  Rub patch adhesive wings for 2 additional minutes.   While looking in a mirror, press and release button in center of patch.  A small green light will flash 3-4 times .  This will be your only indicator the monitor has been turned on.     Do not shower for the first 24 hours.  You may shower after the first 24 hours.   Press button if you feel a symptom. You will hear a small click.  Record Date, Time and Symptom in the Patient Log Book.   When you are ready to remove patch, follow instructions on last 2 pages of Patient Log Book.  Stick patch monitor onto last page of Patient Log Book.   Place Patient Log Book in [place]  box.  Use locking tab on box and tape box closed securely.  The [employer] has [employer] on it.  Please place in mailbox as soon as possible.  Your physician should have your test results approximately 7 days after the monitor has been mailed back to [facility].   Call [place] at [phone] if you have questions regarding your ZIO XT patch monitor.  Call them immediately if you see an orange light blinking on your monitor.   If your monitor falls off in less than 4 days contact our Monitor department at [phone].  If your monitor becomes loose or falls off after 4 days call  [employer] at [phone] for suggestions on securing your monitor.

## 2019-11-21 NOTE — Telephone Encounter (Signed)
Enrolled patient for a 14 day Zio monitor to be mailed to patients home.  

## 2019-11-27 ENCOUNTER — Other Ambulatory Visit (INDEPENDENT_AMBULATORY_CARE_PROVIDER_SITE_OTHER): Payer: BC Managed Care – PPO

## 2019-11-27 DIAGNOSIS — R002 Palpitations: Secondary | ICD-10-CM | POA: Diagnosis not present

## 2019-12-23 ENCOUNTER — Telehealth: Payer: Self-pay | Admitting: Cardiology

## 2019-12-23 NOTE — Telephone Encounter (Signed)
New Message     Pilar Plate is calling from [employer] to report Abnormal findings on a Zio Report

## 2019-12-23 NOTE — Telephone Encounter (Signed)
Report printed and given to Dr Bjorn Pippin to review

## 2020-01-06 ENCOUNTER — Other Ambulatory Visit: Payer: Self-pay | Admitting: *Deleted

## 2020-01-06 MED ORDER — METOPROLOL TARTRATE 25 MG PO TABS: 25.0000 mg | ORAL_TABLET | Freq: Two times a day (BID) | ORAL | 3 refills | Status: DC

## 2020-02-19 ENCOUNTER — Ambulatory Visit: Payer: 59 | Admitting: Cardiology

## 2020-02-19 ENCOUNTER — Other Ambulatory Visit: Payer: Self-pay

## 2020-02-19 ENCOUNTER — Encounter: Payer: Self-pay | Admitting: Cardiology

## 2020-02-19 VITALS — BP 100/70 | HR 83 | Temp 97.0°F | Ht 67.0 in | Wt 189.0 lb

## 2020-02-19 DIAGNOSIS — I471 Supraventricular tachycardia: Secondary | ICD-10-CM | POA: Diagnosis not present

## 2020-02-19 DIAGNOSIS — R002 Palpitations: Secondary | ICD-10-CM

## 2020-02-19 NOTE — Progress Notes (Signed)
Cardiology Office Note:    Date:  02/19/2020   ID:  Jessica Wilkins, DOB 12/26/1980, MRN [id number]  PCP:  [facility] [place]  Cardiologist:  No primary care provider on file.  Electrophysiologist:  None   Referring MD: No ref. provider found   Chief Complaint  Patient presents with  . Palpitations    History of Present Illness:    Jessica Wilkins is a 39 y.o. female with a hx of hypertension who is referred by Danella Penton, PA-C for evaluation of palpitations.  She previously followed with Dr. Delton See, last seen in 2017.  She had persistent sinus tachycardia during pregnancy and was started on labetalol.  Symptoms improved, and she discontinued labetalol after delivery.  She reports that since her delivery she has been having episodes of tachycardia once or twice per year.  Recently has been occurring more frequently, now occurring about once per week.  During these episodes she feels like her heart is racing.  Typically last around 2 minutes, but can last up to an hour.  Denies any lightheadedness or syncope.  No clear triggers.  Has not noted a relationship with exertion.  Reports she does yoga and walks for exercise, denies any exertional chest pain or dyspnea.  She smoked for 8 years, quit smoking 15 years ago.  No family history of heart disease in her immediate family.  TSH 1.9 on 11/19/2019  Cardiac monitor 01/05/2020 showed 2 episodes of SVT, longest lasting 2.5 minutes with max heart rate 231.  She was started on metoprolol 25 mg twice daily.  Reports improvement in palpitations since she started metoprolol, states she has only had one episode since she started taking the medication.  When she had that episode, she took a second dose of metoprolol and palpitations quickly resolved.  She reports that she drinks 2 Cokes zeros per day and will also drink Monster energy drinks a few times per week.  She has 3-4 alcoholic beverages per week.  She denies any  relationship with caffeine or alcohol to her palpitations.    Past Medical History:  Diagnosis Date  . Hypertension     Past Surgical History:  Procedure Laterality Date  . CESAREAN SECTION    . CESAREAN SECTION MULTI-GESTATIONAL N/A 01/07/2016   Procedure: CESAREAN SECTION MULTI-GESTATIONAL;  Surgeon: Candice Camp, MD;  Location: [facility];  Service: Obstetrics;  Laterality: N/A;    Current Medications: Current Meds  Medication Sig  . ALPRAZolam (XANAX) 1 MG tablet Take 1 mg by mouth at bedtime as needed for anxiety.  Marland Kitchen levonorgestrel-ethinyl estradiol (SEASONALE) 0.15-0.03 MG tablet Take 1 tablet by mouth daily.  . metoprolol tartrate (LOPRESSOR) 25 MG tablet Take 1 tablet (25 mg total) by mouth 2 (two) times daily.     Allergies:   Amoxicillin, Cephalexin, and Penicillins   Social History   Socioeconomic History  . Marital status: Married    Spouse name: Not on file  . Number of children: Not on file  . Years of education: Not on file  . Highest education level: Not on file  Occupational History  . Not on file  Tobacco Use  . Smoking status: Never Smoker  . Smokeless tobacco: Never Used  Substance and Sexual Activity  . Alcohol use: No  . Drug use: No  . Sexual activity: Yes    Birth control/protection: None  Other Topics Concern  . Not on file  Social History Narrative  . Not on file  Social Determinants of Health   Financial Resource Strain:   . Difficulty of Paying Living Expenses: Not on file  Food Insecurity:   . Worried About Programme researcher, broadcasting/film/video in the Last Year: Not on file  . Ran Out of Food in the Last Year: Not on file  Transportation Needs:   . Lack of Transportation (Medical): Not on file  . Lack of Transportation (Non-Medical): Not on file  Physical Activity:   . Days of Exercise per Week: Not on file  . Minutes of Exercise per Session: Not on file  Stress:   . Feeling of Stress : Not on file  Social Connections:   . Frequency of  Communication with Friends and Family: Not on file  . Frequency of Social Gatherings with Friends and Family: Not on file  . Attends Religious Services: Not on file  . Active Member of Clubs or Organizations: Not on file  . Attends Banker Meetings: Not on file  . Marital Status: Not on file     Family History: The patient's family history includes Hypertension in her paternal grandmother; Stroke in her paternal grandmother. There is no history of Heart attack.  ROS:   Please see the history of present illness.     All other systems reviewed and are negative.  EKGs/Labs/Other Studies Reviewed:    The following studies were reviewed today:   EKG:  EKG is not ordered today.  The ekg ordered most recently demonstrates normal sinus rhythm, rate 72, no ST/T abnormalities  Cardiac monitor 01/05/20:  Two episodes of SVT, longest lasting 2 minutes 37 seconds with max rate 231 bpm   5 days of data recorded on Zio monitor. Patient had a min HR of 60 bpm, max HR of 231 bpm, and avg HR of 93 bpm. Predominant underlying rhythm was Sinus Rhythm. No VT,  atrial fibrillation, high degree block, or pauses noted. Two episodes of SVT, longest lasting 2 minutes 37 seconds with max rate 231 bpm.  Isolated atrial and ventricular ectopy was rare (<1%). There were 3 triggered events, 2 of which corresponded to sinus rhythm but 1 corresponded to SVT and possible accelerated junctional rhythm.   Holter monitor 09/28/15:  Sinus rhythm to sinus tachycardia for 40 % of the monitoring time.     TTE 09/28/15: - Left ventricle: The cavity size was normal. Systolic function was   normal. The estimated ejection fraction was in the range of 60%   to 65%. Wall motion was normal; there were no regional wall   motion abnormalities. - Aortic valve: Transvalvular velocity was within the normal range.   There was no stenosis. There was no regurgitation. - Mitral valve: Transvalvular velocity was within  the normal range.   There was no evidence for stenosis. There was no regurgitation. - Right ventricle: Systolic function was normal. - Atrial septum: No defect or patent foramen ovale was identified. - Inferior vena cava: The vessel was normal in size. The   respirophasic diameter changes were in the normal range (>= 50%),   consistent with normal central venous pressure.  Recent Labs: No results found for requested labs within last 8760 hours.  Recent Lipid Panel No results found for: CHOL, TRIG, HDL, CHOLHDL, VLDL, LDLCALC, LDLDIRECT  Physical Exam:    VS:  BP 100/70   Pulse 83   Temp (!) 97 F (36.1 C)   Ht 5\' 7"  (1.702 m)   Wt 189 lb (85.7 kg)   SpO2 99%  BMI 29.60 kg/m     Wt Readings from Last 3 Encounters:  02/19/20 189 lb (85.7 kg)  11/21/19 194 lb 9.6 oz (88.3 kg)  04/06/16 200 lb (90.7 kg)     GEN:  Well nourished, well developed in no acute distress HEENT: Normal NECK: No JVD LYMPHATICS: No lymphadenopathy CARDIAC: RRR, no murmurs, rubs, gallops RESPIRATORY:  Clear to auscultation without rales, wheezing or rhonchi  ABDOMEN: Soft, non-tender, non-distended MUSCULOSKELETAL:  No edema; No deformity  SKIN: Warm and dry NEUROLOGIC:  Alert and oriented x 3 PSYCHIATRIC:  Normal affect   ASSESSMENT:    1. SVT (supraventricular tachycardia) (HCC)   2. Palpitations    PLAN:     SVT: presented with palpitations, Zio patch January 2021 showed 2 episodes of SVT, longest lasting 2.5 minutes.  TTE in 2016 showed no structural abnormalities.  No clear triggers.  Appears controlled on metoprolol. -Continue metoprolol 25 mg BID  RTC in 6 months  Medication Adjustments/Labs and Tests Ordered: Current medicines are reviewed at length with the patient today.  Concerns regarding medicines are outlined above.  No orders of the defined types were placed in this encounter.  No orders of the defined types were placed in this encounter.   Patient Instructions   Medication Instructions:  Your physician recommends that you continue on your current medications as directed. Please refer to the Current Medication list given to you today.  *If you need a refill on your cardiac medications before your next appointment, please call your pharmacy*  Lab Work: NONE  Testing/Procedures: NONE  Follow-Up: At [facility], you and your health needs are our priority.  As part of our continuing mission to provide you with exceptional heart care, we have created designated Provider Care Teams.  These Care Teams include your primary Cardiologist (physician) and Advanced Practice Providers (APPs -  Physician Assistants and Nurse Practitioners) who all work together to provide you with the care you need, when you need it.  We recommend signing up for the patient portal called "MyChart".  Sign up information is provided on this After Visit Summary.  MyChart is used to connect with patients for Virtual Visits (Telemedicine).  Patients are able to view lab/test results, encounter notes, upcoming appointments, etc.  Non-urgent messages can be sent to your provider as well.   To learn more about what you can do with MyChart, go to [email or web address].    Your next appointment:   6 month(s)  The format for your next appointment:   Either In Person or Virtual  Provider:   Oswaldo Milian, MD         Signed, Donato Heinz, MD  02/19/2020 6:24 PM    [place]

## 2020-02-19 NOTE — Patient Instructions (Signed)
Medication Instructions:  Your physician recommends that you continue on your current medications as directed. Please refer to the Current Medication list given to you today.  *If you need a refill on your cardiac medications before your next appointment, please call your pharmacy*  Lab Work: NONE  Testing/Procedures: NONE  Follow-Up: At [facility], you and your health needs are our priority.  As part of our continuing mission to provide you with exceptional heart care, we have created designated Provider Care Teams.  These Care Teams include your primary Cardiologist (physician) and Advanced Practice Providers (APPs -  Physician Assistants and Nurse Practitioners) who all work together to provide you with the care you need, when you need it.  We recommend signing up for the patient portal called "MyChart".  Sign up information is provided on this After Visit Summary.  MyChart is used to connect with patients for Virtual Visits (Telemedicine).  Patients are able to view lab/test results, encounter notes, upcoming appointments, etc.  Non-urgent messages can be sent to your provider as well.   To learn more about what you can do with MyChart, go to [email or web address].    Your next appointment:   6 month(s)  The format for your next appointment:   Either In Person or Virtual  Provider:   Epifanio Lesches, MD

## 2020-04-07 ENCOUNTER — Telehealth: Payer: Self-pay | Admitting: Cardiology

## 2020-04-07 NOTE — Telephone Encounter (Signed)
Agree with plan 

## 2020-04-07 NOTE — Telephone Encounter (Signed)
Pt c/o BP issue: STAT if pt c/o blurred vision, one-sided weakness or slurred speech  1. What are your last 5 BP readings? 151/94, 146/95, 149/107, 131/65, 138/93   2. Are you having any other symptoms (ex. Dizziness, headache, blurred vision, passed out)? Just feeling more tired than usual.   3. What is your BP issue? She had her last COVID shot a little over a week ago, she is not sure if this is relate to that or not.  The feeling tired it what prompted her to check her BP, she doesn't have history of hypertension, only when she is pregnant.

## 2020-04-07 NOTE — Telephone Encounter (Signed)
Spoke with patient. Patient reports 2 Friday's ago she got her second Covid vaccine. She developed a fever over the weekend and then the following Monday she felt really tired prompting her to check her blood pressure. She has found that since then she has been slightly hypertensive.   Today she checked her blood pressure and it was 151/94. She proceeded to recheck it 3X and it was higher each time. Advised patient to stop checking blood pressure for now.   Patient does take 25mg  of metoprolol BID. She checked her BP 30 minutes after taking her medication.   Patient provided previous blood pressures from the past few days as: 131/65 HR 90, 138/93 HR 83, 129/95 HR 80 and 131/85 HR 75. No symptoms besides fatigue.  In office appointment made for 5/4 to evaluate hypertension.   Patient instructed to check BP twice a day, once in the AM and once in the PM about 2 hours post medication administration. She will keep a log and bring it to her appointment on 5/4 with 7/4.   Patient instructed to report to the ER if she develops symptoms such as worsened HTN, HA, blurred vision, dizziness, one sided weakness or slurred speech.   Will route to MD as an FYI and for review in case he would like to make any further recommendations.

## 2020-04-13 NOTE — Progress Notes (Deleted)
Cardiology Clinic Note   Patient Name: Jessica Wilkins Date of Encounter: 04/14/2020  Primary Care Provider:  [facility] [place] Primary Cardiologist:  Little Ishikawa, MD  Patient Profile    [name] 39 year old female presents today for evaluation of her hypertension.  Past Medical History    Past Medical History:  Diagnosis Date  . Hypertension    Past Surgical History:  Procedure Laterality Date  . CESAREAN SECTION    . CESAREAN SECTION MULTI-GESTATIONAL N/A 01/07/2016   Procedure: CESAREAN SECTION MULTI-GESTATIONAL;  Surgeon: Candice Camp, MD;  Location: [facility];  Service: Obstetrics;  Laterality: N/A;    Allergies  Allergies  Allergen Reactions  . Amoxicillin Hives  . Cephalexin Hives  . Penicillins Hives    Has patient had a PCN reaction causing immediate rash, facial/tongue/throat swelling, SOB or lightheadedness with hypotension: No Has patient had a PCN reaction causing severe rash involving mucus membranes or skin necrosis: No Has patient had a PCN reaction that required hospitalization No Has patient had a PCN reaction occurring within the last 10 years: No If all of the above answers are "NO", then may proceed with Cephalosporin use.     History of Present Illness    [name] has a PMH of palpitations, and hypertension.  She was previously referred by Danella Penton, PA-C for evaluation of her palpitations.  She was previously followed by Dr. Delton See and was last seen in 2017.  She was noted to have persistent tachycardia during her pregnancy and was started on labetalol at that time.  Her symptoms improved and her labetalol was discontinued after delivery.  She later reported that postdelivery she noticed episodes of palpitations/tachycardia a couple times a year.  When she presented for follow-up evaluation with Dr. Bjorn Pippin she was noticing more frequent episodes of palpitations that would occur once per week.  She  indicated that during these episodes she felt like her heart would race.  It would last around 2 minutes typically but could last for up to an hour.  She denied lightheadedness or syncope.  She did not indicate any clear triggers.  There was no relationship with increased physical activity.  She indicated that she was physically active doing yoga and walking.  She denied exertional chest pain and dyspnea.  Her TSH was 1.9 on November 19, 2019.  A cardiac monitor 01/05/2020 showed 2 episodes of SVT, with the longest lasting 2.5 minutes and a maximum heart rate of 231.  She was started on metoprolol 25 mg twice daily.  On follow-up she noted an improvement with her palpitations after starting metoprolol.  She stated that she had only one episode of palpitations since initiating the medication.  With the episode she took a second dose of metoprolol and her palpitations resolved.  She also reported that she was drinking 2 Cokes zeros per day and would also drink Monster energy drinks a few times per week.  She would consume 3-4 alcoholic beverages per week.  She denied a relationship with caffeine or alcohol in her palpitations.  She presents the clinic today for further evaluation and states***  *** denies chest pain, shortness of breath, lower extremity edema, fatigue, palpitations, melena, hematuria, hemoptysis, diaphoresis, weakness, presyncope, syncope, orthopnea, and PND.   She contacted triage line on 04/07/2020 indicating that she had been having higher than usual blood pressures.  She indicated that she had received her second COVID-19 vaccination 1 week prior.  She presents to the  clinic today for further evaluation and states***  *** denies chest pain, shortness of breath, lower extremity edema, fatigue, palpitations, melena, hematuria, hemoptysis, diaphoresis, weakness, presyncope, syncope, orthopnea, and PND.   Home Medications    Prior to Admission medications   Medication Sig Start Date  End Date Taking? Authorizing Provider  ALPRAZolam Duanne Moron) 1 MG tablet Take 1 mg by mouth at bedtime as needed for anxiety.    [provider]  levonorgestrel-ethinyl estradiol (SEASONALE) 0.15-0.03 MG tablet Take 1 tablet by mouth daily.    [provider]  metoprolol tartrate (LOPRESSOR) 25 MG tablet Take 1 tablet (25 mg total) by mouth 2 (two) times daily. 01/06/20 04/05/20  Donato Heinz, MD    Family History    Family History  Problem Relation Age of Onset  . Stroke Paternal Grandmother   . Hypertension Paternal Grandmother   . Heart attack Neg Hx    She indicated that her mother is alive. She indicated that her father is alive.  Social History    Social History   Socioeconomic History  . Marital status: Married    Spouse name: Not on file  . Number of children: Not on file  . Years of education: Not on file  . Highest education level: Not on file  Occupational History  . Not on file  Tobacco Use  . Smoking status: Never Smoker  . Smokeless tobacco: Never Used  Substance and Sexual Activity  . Alcohol use: No  . Drug use: No  . Sexual activity: Yes    Birth control/protection: None  Other Topics Concern  . Not on file  Social History Narrative  . Not on file   Social Determinants of Health   Financial Resource Strain:   . Difficulty of Paying Living Expenses:   Food Insecurity:   . Worried About Charity fundraiser in the Last Year:   . Arboriculturist in the Last Year:   Transportation Needs:   . Film/video editor (Medical):   Marland Kitchen Lack of Transportation (Non-Medical):   Physical Activity:   . Days of Exercise per Week:   . Minutes of Exercise per Session:   Stress:   . Feeling of Stress :   Social Connections:   . Frequency of Communication with Friends and Family:   . Frequency of Social Gatherings with Friends and Family:   . Attends Religious Services:   . Active Member of Clubs or Organizations:   . Attends Theatre manager Meetings:   Marland Kitchen Marital Status:   Intimate Partner Violence:   . Fear of Current or Ex-Partner:   . Emotionally Abused:   Marland Kitchen Physically Abused:   . Sexually Abused:      Review of Systems    General:  No chills, fever, night sweats or weight changes.  Cardiovascular:  No chest pain, dyspnea on exertion, edema, orthopnea, palpitations, paroxysmal nocturnal dyspnea. Dermatological: No rash, lesions/masses Respiratory: No cough, dyspnea Urologic: No hematuria, dysuria Abdominal:   No nausea, vomiting, diarrhea, bright red blood per rectum, melena, or hematemesis Neurologic:  No visual changes, wkns, changes in mental status. All other systems reviewed and are otherwise negative except as noted above.  Physical Exam    VS:  There were no vitals taken for this visit. , BMI There is no height or weight on file to calculate BMI. GEN: Well nourished, well developed, in no acute distress. HEENT: normal. Neck: Supple, no JVD, carotid bruits, or masses. Cardiac: RRR,  no murmurs, rubs, or gallops. No clubbing, cyanosis, edema.  Radials/DP/PT 2+ and equal bilaterally.  Respiratory:  Respirations regular and unlabored, clear to auscultation bilaterally. GI: Soft, nontender, nondistended, BS + x 4. MS: no deformity or atrophy. Skin: warm and dry, no rash. Neuro:  Strength and sensation are intact. Psych: Normal affect.  Accessory Clinical Findings    ECG personally reviewed by me today- *** - No acute changes  EKG 11/21/2019 Normal sinus rhythm 72 bpm  Echocardiogram 09/28/2015 Study Conclusions   - Left ventricle: The cavity size was normal. Systolic function was  normal. The estimated ejection fraction was in the range of 60%  to 65%. Wall motion was normal; there were no regional wall  motion abnormalities.  - Aortic valve: Transvalvular velocity was within the normal range.  There was no stenosis. There was no regurgitation.  - Mitral valve: Transvalvular  velocity was within the normal range.  There was no evidence for stenosis. There was no regurgitation.  - Right ventricle: Systolic function was normal.  - Atrial septum: No defect or patent foramen ovale was identified.  - Inferior vena cava: The vessel was normal in size. The  respirophasic diameter changes were in the normal range (>= 50%),  consistent with normal central venous pressure.   Assessment & Plan   1.  Essential hypertension-BP today***.  Contacted nurse triage line on 04/07/2020 indicating that her blood pressures have been elevated. Continue metoprolol tartrate 25 mg twice daily Start HCTZ 12.5 mg daily Heart healthy low-sodium diet-salty 6 given Increase physical activity as tolerated Blood pressure log Order BMP in 1 week.  Palpitations-heart rate today ***.  No recent episodes of palpitations or rapid heart rate.  Zio patch 1/21 showed 2 episodes of SVT lasting longer than 2.5 minutes Continue metoprolol Avoid triggers caffeine, chocolate, EtOH etc. Heart healthy low-sodium diet Increase physical activity as tolerated  Disposition: Follow-up with me in 1 month.  Thomasene Ripple. Lamont Tant NP-C    04/14/2020, 7:18 AM [facility] [address] Office [phone] Fax [phone]

## 2020-04-14 ENCOUNTER — Ambulatory Visit: Payer: 59 | Admitting: General Practice

## 2020-04-16 NOTE — Progress Notes (Signed)
Cardiology Clinic Note   Patient Name: Jessica Wilkins Date of Encounter: 04/17/2020  Primary Care Provider:  [facility] Cardiologist:  Little Ishikawa, MD  Patient Profile    Jessica Saxon. Wilkins 39 year old female presents today for evaluation of her hypertension.  Past Medical History    Past Medical History:  Diagnosis Date  . Hypertension    Past Surgical History:  Procedure Laterality Date  . CESAREAN SECTION    . CESAREAN SECTION MULTI-GESTATIONAL N/A 01/07/2016   Procedure: CESAREAN SECTION MULTI-GESTATIONAL;  Surgeon: Candice Camp, MD;  Location: [facility];  Service: Obstetrics;  Laterality: N/A;    Allergies  Allergies  Allergen Reactions  . Amoxicillin Hives  . Cephalexin Hives  . Penicillins Hives    Has patient had a PCN reaction causing immediate rash, facial/tongue/throat swelling, SOB or lightheadedness with hypotension: No Has patient had a PCN reaction causing severe rash involving mucus membranes or skin necrosis: No Has patient had a PCN reaction that required hospitalization No Has patient had a PCN reaction occurring within the last 10 years: No If all of the above answers are "NO", then may proceed with Cephalosporin use.     History of Present Illness    Jessica Wilkins has a PMH of palpitations, and hypertension.  She was previously referred by Danella Penton, PA-C for evaluation of her palpitations.  She was previously followed by Dr. Delton See and was last seen in 2017.  She was noted to have persistent tachycardia during her pregnancy and was started on labetalol at that time.  Her symptoms improved and her labetalol was discontinued after delivery.  She later reported that postdelivery she noticed episodes of palpitations/tachycardia a couple times a year.  When she presented for follow-up evaluation with Dr. Bjorn Pippin she was noticing more frequent episodes of palpitations that would occur once per week.  She  indicated that during these episodes she felt like her heart would race.  It would last around 2 minutes typically but could last for up to an hour.  She denied lightheadedness or syncope.  She did not indicate any clear triggers.  There was no relationship with increased physical activity.  She indicated that she was physically active doing yoga and walking.  She denied exertional chest pain and dyspnea.  Her TSH was 1.9 on November 19, 2019.  A cardiac monitor 01/05/2020 showed 2 episodes of SVT, with the longest lasting 2.5 minutes and a maximum heart rate of 231.  She was started on metoprolol 25 mg twice daily.   On follow-up she noted an improvement with her palpitations after starting metoprolol.  She stated that she had only one episode of palpitations since initiating the medication.  With the episode she took a second dose of metoprolol and her palpitations resolved.  She also reported that she was drinking 2 Cokes zeros per day and would also drink Monster energy drinks a few times per week.  She would consume 3-4 alcoholic beverages per week.  She denied a relationship with caffeine or alcohol in her palpitations.   She contacted triage line on 04/07/2020 indicating that she had been having higher than usual blood pressures.  She indicated that she had received her second COVID-19 vaccination 1 week prior.   She presents to the clinic today for further evaluation and states she has noticed increased blood pressures since 04/07/2020.  She was not routinely checking her blood pressures until a week after her COVID-19 second injection  when she felt some fatigue.  She brings her log in today that shows blood pressure was in the 150s over mid 80s.  She has no other cardiac complaints at this time.  No increased stress, no increased caffeine, no changes in diet.  I will give her the salty 6 sheet, secondary causes of hypertension, have her increase her physical activity and give her a blood pressure log.  I  will have her follow-up in 1 month for reevaluation.   She denies chest pain, shortness of breath, lower extremity edema, fatigue, palpitations, melena, hematuria, hemoptysis, diaphoresis, weakness, presyncope, syncope, orthopnea, and PND.  Home Medications    Prior to Admission medications   Medication Sig Start Date End Date Taking? Authorizing Provider  ALPRAZolam Prudy Feeler) 1 MG tablet Take 1 mg by mouth at bedtime as needed for anxiety.    [provider]  levonorgestrel-ethinyl estradiol (SEASONALE) 0.15-0.03 MG tablet Take 1 tablet by mouth daily.    [provider]  metoprolol tartrate (LOPRESSOR) 25 MG tablet Take 1 tablet (25 mg total) by mouth 2 (two) times daily. 01/06/20 04/05/20  Little Ishikawa, MD    Family History    Family History  Problem Relation Age of Onset  . Stroke Paternal Grandmother   . Hypertension Paternal Grandmother   . Heart attack Neg Hx    She indicated that her mother is alive. She indicated that her father is alive.  Social History    Social History   Socioeconomic History  . Marital status: Married    Spouse name: Not on file  . Number of children: Not on file  . Years of education: Not on file  . Highest education level: Not on file  Occupational History  . Not on file  Tobacco Use  . Smoking status: Never Smoker  . Smokeless tobacco: Never Used  Substance and Sexual Activity  . Alcohol use: No  . Drug use: No  . Sexual activity: Yes    Birth control/protection: None  Other Topics Concern  . Not on file  Social History Narrative  . Not on file   Social Determinants of Health   Financial Resource Strain:   . Difficulty of Paying Living Expenses:   Food Insecurity:   . Worried About Programme researcher, broadcasting/film/video in the Last Year:   . Barista in the Last Year:   Transportation Needs:   . Freight forwarder (Medical):   Marland Kitchen Lack of Transportation (Non-Medical):   Physical Activity:   . Days of Exercise  per Week:   . Minutes of Exercise per Session:   Stress:   . Feeling of Stress :   Social Connections:   . Frequency of Communication with Friends and Family:   . Frequency of Social Gatherings with Friends and Family:   . Attends Religious Services:   . Active Member of Clubs or Organizations:   . Attends Banker Meetings:   Marland Kitchen Marital Status:   Intimate Partner Violence:   . Fear of Current or Ex-Partner:   . Emotionally Abused:   Marland Kitchen Physically Abused:   . Sexually Abused:      Review of Systems    General:  No chills, fever, night sweats or weight changes.  Cardiovascular:  No chest pain, dyspnea on exertion, edema, orthopnea, palpitations, paroxysmal nocturnal dyspnea. Dermatological: No rash, lesions/masses Respiratory: No cough, dyspnea Urologic: No hematuria, dysuria Abdominal:   No nausea, vomiting, diarrhea, bright red blood per rectum, melena,  or hematemesis Neurologic:  No visual changes, wkns, changes in mental status. All other systems reviewed and are otherwise negative except as noted above.  Physical Exam    VS:  BP 110/84   Pulse 78   Temp 97.8 F (36.6 C)   Ht 5\' 9"  (1.753 m)   Wt 196 lb (88.9 kg)   SpO2 98%   BMI 28.94 kg/m  , BMI Body mass index is 28.94 kg/m. GEN: Well nourished, well developed, in no acute distress. HEENT: normal. Neck: Supple, no JVD, carotid bruits, or masses. Cardiac: RRR, no murmurs, rubs, or gallops. No clubbing, cyanosis, edema.  Radials/DP/PT 2+ and equal bilaterally.  Respiratory:  Respirations regular and unlabored, clear to auscultation bilaterally. GI: Soft, nontender, nondistended, BS + x 4. MS: no deformity or atrophy. Skin: warm and dry, no rash. Neuro:  Strength and sensation are intact. Psych: Normal affect.  Accessory Clinical Findings    ECG personally reviewed by me today-none today. EKG 11/21/2019 Normal sinus rhythm 72 bpm   Echocardiogram 09/28/2015 Study Conclusions   - Left  ventricle: The cavity size was normal. Systolic function was    normal. The estimated ejection fraction was in the range of 60%    to 65%. Wall motion was normal; there were no regional wall    motion abnormalities.  - Aortic valve: Transvalvular velocity was within the normal range.    There was no stenosis. There was no regurgitation.  - Mitral valve: Transvalvular velocity was within the normal range.    There was no evidence for stenosis. There was no regurgitation.  - Right ventricle: Systolic function was normal.  - Atrial septum: No defect or patent foramen ovale was identified.  - Inferior vena cava: The vessel was normal in size. The    respirophasic diameter changes were in the normal range (>= 50%),    consistent with normal central venous pressure.   Assessment & Plan   1. Essential hypertension-BP today 110/84.  Contacted nurse triage line on 04/07/2020 indicating that her blood pressures have been elevated. Continue metoprolol tartrate 25 mg twice daily Heart healthy low-sodium diet-salty 6 given, secondary causes of hypertension sheet given  increase physical activity as tolerated-15 to 30 minutes walking most days of the week. Blood pressure log   Palpitations-heart rate today  78 bpm.  No recent episodes of palpitations or rapid heart rate.  Zio patch 1/21 showed 2 episodes of SVT lasting longer than 2.5 minutes Continue metoprolol Avoid triggers caffeine, chocolate, EtOH etc. Heart healthy low-sodium diet Increase physical activity as tolerated   Disposition: Follow-up with me in 1 month.  Jossie Ng. Tilla Wilborn NP-C    04/17/2020, 2:07 PM [address] [place] Office [phone] Fax [phone]

## 2020-04-17 ENCOUNTER — Encounter: Payer: Self-pay | Admitting: General Practice

## 2020-04-17 ENCOUNTER — Ambulatory Visit: Payer: 59 | Admitting: General Practice

## 2020-04-17 ENCOUNTER — Other Ambulatory Visit: Payer: Self-pay

## 2020-04-17 VITALS — BP 110/84 | HR 78 | Temp 97.8°F | Ht 69.0 in | Wt 196.0 lb

## 2020-04-17 DIAGNOSIS — I10 Essential (primary) hypertension: Secondary | ICD-10-CM

## 2020-04-17 DIAGNOSIS — R002 Palpitations: Secondary | ICD-10-CM

## 2020-04-17 NOTE — Patient Instructions (Signed)
Medication Instructions:  Your physician recommends that you continue on your current medications as directed. Please refer to the Current Medication list given to you today.  *If you need a refill on your cardiac medications before your next appointment, please call your pharmacy*   Lab Work: NONE  Testing/Procedures: NONE  Follow-Up: At [facility], you and your health needs are our priority.  As part of our continuing mission to provide you with exceptional heart care, we have created designated Provider Care Teams.  These Care Teams include your primary Cardiologist (physician) and Advanced Practice Providers (APPs -  Physician Assistants and Nurse Practitioners) who all work together to provide you with the care you need, when you need it.  We recommend signing up for the patient portal called "MyChart".  Sign up information is provided on this After Visit Summary.  MyChart is used to connect with patients for Virtual Visits (Telemedicine).  Patients are able to view lab/test results, encounter notes, upcoming appointments, etc.  Non-urgent messages can be sent to your provider as well.   To learn more about what you can do with MyChart, go to [email or web address].    Your next appointment:   4 week(s)  The format for your next appointment:   In Person  Provider:   WITH JESSE C NP   Other Instructions  MONITOR YOUR BLOOD PRESSURE ONCE DAILY UNLESS YOU ARE FEELING BAD TAKE YOUR BLOOD PRESSURE 1 HOUR AFTER TAKING MEDICATIONS, LOG AND BRING TO YOUR FOLLOW UP   INCREASE YOUR ACTIVITY AS TOLERATED WITH GOAL OF WALKING 15-30 MINUTES 5 OUT OF 7 DAYS

## 2020-05-14 NOTE — Progress Notes (Signed)
Cardiology Clinic Note   Patient Name: Jessica Wilkins Date of Encounter: 05/15/2020  Primary Care Provider:  [facility] Cardiologist:  Little Ishikawa, MD  Patient Profile    Jessica Wilkins. Jessica Wilkins 39 year old female presents today for evaluation of her hypertension.  Past Medical History    Past Medical History:  Diagnosis Date  . Hypertension    Past Surgical History:  Procedure Laterality Date  . CESAREAN SECTION    . CESAREAN SECTION MULTI-GESTATIONAL N/A 01/07/2016   Procedure: CESAREAN SECTION MULTI-GESTATIONAL;  Surgeon: Candice Camp, MD;  Location: [facility];  Service: Obstetrics;  Laterality: N/A;    Allergies  Allergies  Allergen Reactions  . Amoxicillin Hives  . Cephalexin Hives  . Penicillins Hives    Has patient had a PCN reaction causing immediate rash, facial/tongue/throat swelling, SOB or lightheadedness with hypotension: No Has patient had a PCN reaction causing severe rash involving mucus membranes or skin necrosis: No Has patient had a PCN reaction that required hospitalization No Has patient had a PCN reaction occurring within the last 10 years: No If all of the above answers are "NO", then may proceed with Cephalosporin use.     History of Present Illness    [name] has a PMH of palpitations, and hypertension. She was previously referred by Danella Penton, PA-C for evaluation of her palpitations. She was previously followed by Dr. Delton See and was last seen in 2017. She was noted to have persistent tachycardia during her pregnancy and was started on labetalol at that time. Her symptoms improved and her labetalol was discontinued after delivery. She later reported that postdelivery she noticed episodes of palpitations/tachycardia a couple times a year. When she presented for follow-up evaluation with Dr. Gardiner Coins was noticing more frequent episodes of palpitations that would occur once per week. She  indicated that during these episodes she felt like her heart would race. It would last around 2 minutes typically but could last for up to an hour. She denied lightheadedness or syncope. She did not indicate any clear triggers. There was no relationship with increased physical activity. She indicated that she was physically active doing yoga and walking. She denied exertional chest pain and dyspnea. Her TSH was 1.9 on November 19, 2019. A cardiac monitor 01/05/2020 showed 2 episodes of SVT, with the longest lasting 2.5 minutes and a maximum heart rate of 231. She was started on metoprolol 25 mg twice daily.  On follow-up she noted an improvement with her palpitations after starting metoprolol. She stated that she had only one episode of palpitations since initiating the medication. With the episode she took a second dose of metoprolol and her palpitations resolved. She also reported that she was drinking 2 Cokes zeros per day and would also drink Monster energy drinks a few times per week. She would consume 3-4 alcoholic beverages per week. She denied a relationship with caffeine or alcohol in her palpitations.  She contacted triage line on 04/07/2020 indicating that she had been having higher than usual blood pressures. She indicated that she had received her second COVID-19 vaccination 1 week prior.  She presented tothe clinic 04/17/20 for further evaluation and stated she had noticed increased blood pressures since 04/07/2020.  She was not routinely checking her blood pressures until a week after her COVID-19 second injection when she felt some fatigue.  She brought her log in  that showed blood pressure was in the 150s over mid 80s.  She had no  other cardiac complaints at that time.  No increased stress, no increased caffeine, no changes in diet.  I  gave her the salty 6 sheet, secondary causes of hypertension, had her increase her physical activity and gave her a blood pressure log.  I  planned follow-up for 1 month   She presents to the clinic today for follow-up evaluation and states she has increased her physical activity and is doing walking workouts 3-4 times per week for 20 to 30 minutes.  She is also trying to reduce the amount of sodium in her diet.  She states that her blood pressures have continued to be somewhat elevated.  We checked her blood pressure cuff with the blood pressure cuff and the office.  Her blood pressure cuff is reading 30 points-40 points higher systolic than the calibrated office blood pressure cuff.  I have no concern for high blood pressure at this time.  I will continue her metoprolol tartrate 25 mg twice daily, have her continue to follow with the low-sodium diet salty 6 sheet, and have her follow-up in 6 months.   Today she denies chest pain, shortness of breath, lower extremity edema, fatigue, palpitations, melena, hematuria, hemoptysis, diaphoresis, weakness, presyncope, syncope, orthopnea, and PND.  Home Medications    Prior to Admission medications   Medication Sig Start Date End Date Taking? Authorizing Provider  ALPRAZolam Prudy Feeler) 1 MG tablet Take 1 mg by mouth at bedtime as needed for anxiety.    [provider]  levonorgestrel-ethinyl estradiol (SEASONALE) 0.15-0.03 MG tablet Take 1 tablet by mouth daily.    [provider]  metoprolol tartrate (LOPRESSOR) 25 MG tablet Take 1 tablet (25 mg total) by mouth 2 (two) times daily. 01/06/20 04/05/20  Little Ishikawa, MD    Family History    Family History  Problem Relation Age of Onset  . Stroke Paternal Grandmother   . Hypertension Paternal Grandmother   . Heart attack Neg Hx    She indicated that her mother is alive. She indicated that her father is alive.  Social History    Social History   Socioeconomic History  . Marital status: Married    Spouse name: Not on file  . Number of children: Not on file  . Years of education: Not on file  . Highest  education level: Not on file  Occupational History  . Not on file  Tobacco Use  . Smoking status: Never Smoker  . Smokeless tobacco: Never Used  Substance and Sexual Activity  . Alcohol use: No  . Drug use: No  . Sexual activity: Yes    Birth control/protection: None  Other Topics Concern  . Not on file  Social History Narrative  . Not on file   Social Determinants of Health   Financial Resource Strain:   . Difficulty of Paying Living Expenses:   Food Insecurity:   . Worried About Programme researcher, broadcasting/film/video in the Last Year:   . Barista in the Last Year:   Transportation Needs:   . Freight forwarder (Medical):   Marland Kitchen Lack of Transportation (Non-Medical):   Physical Activity:   . Days of Exercise per Week:   . Minutes of Exercise per Session:   Stress:   . Feeling of Stress :   Social Connections:   . Frequency of Communication with Friends and Family:   . Frequency of Social Gatherings with Friends and Family:   . Attends Religious Services:   . Active Member  of Clubs or Organizations:   . Attends Archivist Meetings:   Marland Kitchen Marital Status:   Intimate Partner Violence:   . Fear of Current or Ex-Partner:   . Emotionally Abused:   Marland Kitchen Physically Abused:   . Sexually Abused:      Review of Systems    General:  No chills, fever, night sweats or weight changes.  Cardiovascular:  No chest pain, dyspnea on exertion, edema, orthopnea, palpitations, paroxysmal nocturnal dyspnea. Dermatological: No rash, lesions/masses Respiratory: No cough, dyspnea Urologic: No hematuria, dysuria Abdominal:   No nausea, vomiting, diarrhea, bright red blood per rectum, melena, or hematemesis Neurologic:  No visual changes, wkns, changes in mental status. All other systems reviewed and are otherwise negative except as noted above.  Physical Exam    VS:  BP 110/70   Pulse 77   Temp 98.2 F (36.8 C)   Ht 5\' 9"  (1.753 m)   Wt 196 lb 6.4 oz (89.1 kg)   SpO2 98%   BMI 29.00  kg/m  , BMI Body mass index is 29 kg/m. GEN: Well nourished, well developed, in no acute distress. HEENT: normal. Neck: Supple, no JVD, carotid bruits, or masses. Cardiac: RRR, no murmurs, rubs, or gallops. No clubbing, cyanosis, edema.  Radials/DP/PT 2+ and equal bilaterally.  Respiratory:  Respirations regular and unlabored, clear to auscultation bilaterally. GI: Soft, nontender, nondistended, BS + x 4. MS: no deformity or atrophy. Skin: warm and dry, no rash. Neuro:  Strength and sensation are intact. Psych: Normal affect.  Accessory Clinical Findings    ECG personally reviewed by me today-none today.    EKG 11/21/2019 Normal sinus rhythm 72 bpm  Echocardiogram 09/28/2015 Study Conclusions   - Left ventricle: The cavity size was normal. Systolic function was  normal. The estimated ejection fraction was in the range of 60%  to 65%. Wall motion was normal; there were no regional wall  motion abnormalities.  - Aortic valve: Transvalvular velocity was within the normal range.  There was no stenosis. There was no regurgitation.  - Mitral valve: Transvalvular velocity was within the normal range.  There was no evidence for stenosis. There was no regurgitation.  - Right ventricle: Systolic function was normal.  - Atrial septum: No defect or patent foramen ovale was identified.  - Inferior vena cava: The vessel was normal in size. The  respirophasic diameter changes were in the normal range (>= 50%),  consistent with normal central venous pressure.  Assessment & Plan   1.  Essential hypertension-BP today 110/70. Contacted nurse triage line on 04/07/2020 indicating that her blood pressures have been elevated. Continuemetoprolol tartrate 25 mg twice daily Heart healthy low-sodium diet-salty 6 given, secondary causes of hypertension sheet given  increase physical activity as tolerated-15 to 30 minutes walking most days of the week. Continue blood pressure log 1-2  times weekly  Palpitations-heart rate today 77 bpm. No recent episodes of palpitations or rapid heart rate. Zio patch 1/21 showed 2 episodes of SVT lasting longer than 2.5 minutes Continue metoprolol Avoid triggers caffeine, chocolate, EtOH etc. Heart healthy low-sodium diet Increase physical activity as tolerated  Disposition: Follow-up with Dr. Gardiner Rhyme in 6 months.  Jossie Ng. Chaylee Ehrsam NP-C    05/15/2020, 1:56 PM [facility] [place] [address] Office [phone] Fax [phone]

## 2020-05-15 ENCOUNTER — Ambulatory Visit: Payer: No Typology Code available for payment source | Admitting: General Practice

## 2020-05-15 ENCOUNTER — Other Ambulatory Visit: Payer: Self-pay

## 2020-05-15 ENCOUNTER — Encounter: Payer: Self-pay | Admitting: General Practice

## 2020-05-15 VITALS — BP 110/70 | HR 77 | Temp 98.2°F | Ht 69.0 in | Wt 196.4 lb

## 2020-05-15 DIAGNOSIS — R002 Palpitations: Secondary | ICD-10-CM | POA: Diagnosis not present

## 2020-05-15 DIAGNOSIS — I10 Essential (primary) hypertension: Secondary | ICD-10-CM

## 2020-05-15 NOTE — Patient Instructions (Signed)
Medication Instructions:  Your physician recommends that you continue on your current medications as directed. Please refer to the Current Medication list given to you today.  *If you need a refill on your cardiac medications before your next appointment, please call your pharmacy*  Lab Work: NONE ordered at this time of appointment   If you have labs (blood work) drawn today and your tests are completely normal, you will receive your results only by: Marland Kitchen MyChart Message (if you have MyChart) OR . A paper copy in the mail If you have any lab test that is abnormal or we need to change your treatment, we will call you to review the results.  Testing/Procedures: NONE ordered at this time of appointment   Follow-Up: At [facility], you and your health needs are our priority.  As part of our continuing mission to provide you with exceptional heart care, we have created designated Provider Care Teams.  These Care Teams include your primary Cardiologist (physician) and Advanced Practice Providers (APPs -  Physician Assistants and Nurse Practitioners) who all work together to provide you with the care you need, when you need it.  We recommend signing up for the patient portal called "MyChart".  Sign up information is provided on this After Visit Summary.  MyChart is used to connect with patients for Virtual Visits (Telemedicine).  Patients are able to view lab/test results, encounter notes, upcoming appointments, etc.  Non-urgent messages can be sent to your provider as well.   To learn more about what you can do with MyChart, go to [email or web address].    Your next appointment:   6 month(s)  The format for your next appointment:   In Person  Provider:   Epifanio Lesches, MD  Other Instructions  Continue to maintain physical activity 20-30 minutes a day

## 2020-11-16 ENCOUNTER — Encounter: Payer: Self-pay | Admitting: Cardiology

## 2020-11-16 ENCOUNTER — Other Ambulatory Visit: Payer: Self-pay

## 2020-11-16 ENCOUNTER — Ambulatory Visit: Payer: No Typology Code available for payment source | Admitting: Cardiology

## 2020-11-16 VITALS — BP 122/90 | HR 82 | Ht 69.0 in | Wt 189.6 lb

## 2020-11-16 DIAGNOSIS — I471 Supraventricular tachycardia: Secondary | ICD-10-CM | POA: Diagnosis not present

## 2020-11-16 DIAGNOSIS — I10 Essential (primary) hypertension: Secondary | ICD-10-CM

## 2020-11-16 MED ORDER — METOPROLOL SUCCINATE ER 50 MG PO TB24: 50.0000 mg | ORAL_TABLET | Freq: Every day | ORAL | 3 refills | Status: DC

## 2020-11-16 NOTE — Patient Instructions (Signed)
Medication Instructions:  STOP metoprolol tartrate (Lopressor) START metoprolol succinate (Toprol XL) 50 mg once daily  *If you need a refill on your cardiac medications before your next appointment, please call your pharmacy*  Follow-Up: At [facility], you and your health needs are our priority.  As part of our continuing mission to provide you with exceptional heart care, we have created designated Provider Care Teams.  These Care Teams include your primary Cardiologist (physician) and Advanced Practice Providers (APPs -  Physician Assistants and Nurse Practitioners) who all work together to provide you with the care you need, when you need it.  We recommend signing up for the patient portal called "MyChart".  Sign up information is provided on this After Visit Summary.  MyChart is used to connect with patients for Virtual Visits (Telemedicine).  Patients are able to view lab/test results, encounter notes, upcoming appointments, etc.  Non-urgent messages can be sent to your provider as well.   To learn more about what you can do with MyChart, go to [email or web address].    Your next appointment:   12 month(s)  The format for your next appointment:   In Person  Provider:   Epifanio Lesches, MD

## 2020-11-16 NOTE — Progress Notes (Signed)
Cardiology Office Note:    Date:  11/17/2020   ID:  Jessica Wilkins, DOB [birth year], MRN [id number]  PCP:  [facility] [place]  Cardiologist:  Little Ishikawa, MD  Electrophysiologist:  None   Referring MD: [facility] Petit*   Chief Complaint  Patient presents with  . Tachycardia    History of Present Illness:    Jessica Wilkins is a 39 y.o. female with a hx of hypertension who is referred by Danella Penton, PA-C for evaluation of palpitations.  She previously followed with Dr. Delton See, last seen in 2017.  She had persistent sinus tachycardia during pregnancy and was started on labetalol.  Symptoms improved, and she discontinued labetalol after delivery.  She reports that since her delivery she has been having episodes of tachycardia once or twice per year.  Recently has been occurring more frequently, now occurring about once per week.  During these episodes she feels like her heart is racing.  Typically last around 2 minutes, but can last up to an hour.  Denies any lightheadedness or syncope.  No clear triggers.  Has not noted a relationship with exertion.  Reports she does yoga and walks for exercise, denies any exertional chest pain or dyspnea.  She smoked for 8 years, quit smoking 15 years ago.  No family history of heart disease in her immediate family.  TSH 1.9 on 11/19/2019  Cardiac monitor 01/05/2020 showed 2 episodes of SVT, longest lasting 2.5 minutes with max heart rate 231.  She was started on metoprolol 25 mg twice daily.  Since last clinic visit, she reports that she has been doing well.  Palpitations have been under good control, states that they have occurred rarely, about once every few months.  She denies any chest pain, dyspnea, lightheadedness, syncope, or lower extremity edema.    Past Medical History:  Diagnosis Date  . Hypertension     Past Surgical History:  Procedure Laterality Date  . CESAREAN SECTION    . CESAREAN  SECTION MULTI-GESTATIONAL N/A 01/07/2016   Procedure: CESAREAN SECTION MULTI-GESTATIONAL;  Surgeon: Candice Camp, MD;  Location: [facility];  Service: Obstetrics;  Laterality: N/A;    Current Medications: No outpatient medications have been marked as taking for the 11/16/20 encounter (Office Visit) with Little Ishikawa, MD.     Allergies:   Amoxicillin, Cephalexin, and Penicillins   Social History   Socioeconomic History  . Marital status: Married    Spouse name: Not on file  . Number of children: Not on file  . Years of education: Not on file  . Highest education level: Not on file  Occupational History  . Not on file  Tobacco Use  . Smoking status: Never Smoker  . Smokeless tobacco: Never Used  Substance and Sexual Activity  . Alcohol use: No  . Drug use: No  . Sexual activity: Yes    Birth control/protection: None  Other Topics Concern  . Not on file  Social History Narrative  . Not on file   Social Determinants of Health   Financial Resource Strain:   . Difficulty of Paying Living Expenses: Not on file  Food Insecurity:   . Worried About Programme researcher, broadcasting/film/video in the Last Year: Not on file  . Ran Out of Food in the Last Year: Not on file  Transportation Needs:   . Lack of Transportation (Medical): Not on file  . Lack of Transportation (Non-Medical): Not on file  Physical Activity:   .  Days of Exercise per Week: Not on file  . Minutes of Exercise per Session: Not on file  Stress:   . Feeling of Stress : Not on file  Social Connections:   . Frequency of Communication with Friends and Family: Not on file  . Frequency of Social Gatherings with Friends and Family: Not on file  . Attends Religious Services: Not on file  . Active Member of Clubs or Organizations: Not on file  . Attends Banker Meetings: Not on file  . Marital Status: Not on file     Family History: The patient's family history includes Hypertension in her paternal grandmother; Stroke  in her paternal grandmother. There is no history of Heart attack.  ROS:   Please see the history of present illness.     All other systems reviewed and are negative.  EKGs/Labs/Other Studies Reviewed:    The following studies were reviewed today:   EKG:  EKG is ordered today.  The ekg ordered demonstrates normal sinus rhythm, rate 82, no ST/T abnormalities  Cardiac monitor 01/05/20:  Two episodes of SVT, longest lasting 2 minutes 37 seconds with max rate 231 bpm   5 days of data recorded on Zio monitor. Patient had a min HR of 60 bpm, max HR of 231 bpm, and avg HR of 93 bpm. Predominant underlying rhythm was Sinus Rhythm. No VT,  atrial fibrillation, high degree block, or pauses noted. Two episodes of SVT, longest lasting 2 minutes 37 seconds with max rate 231 bpm.  Isolated atrial and ventricular ectopy was rare (<1%). There were 3 triggered events, 2 of which corresponded to sinus rhythm but 1 corresponded to SVT and possible accelerated junctional rhythm.   Holter monitor 09/28/15:  Sinus rhythm to sinus tachycardia for 40 % of the monitoring time.     TTE 09/28/15: - Left ventricle: The cavity size was normal. Systolic function was   normal. The estimated ejection fraction was in the range of 60%   to 65%. Wall motion was normal; there were no regional wall   motion abnormalities. - Aortic valve: Transvalvular velocity was within the normal range.   There was no stenosis. There was no regurgitation. - Mitral valve: Transvalvular velocity was within the normal range.   There was no evidence for stenosis. There was no regurgitation. - Right ventricle: Systolic function was normal. - Atrial septum: No defect or patent foramen ovale was identified. - Inferior vena cava: The vessel was normal in size. The   respirophasic diameter changes were in the normal range (>= 50%),   consistent with normal central venous pressure.  Recent Labs: No results found for requested labs  within last 8760 hours.  Recent Lipid Panel No results found for: CHOL, TRIG, HDL, CHOLHDL, VLDL, LDLCALC, LDLDIRECT  Physical Exam:    VS:  BP 122/90   Pulse 82   Ht 5\' 9"  (1.753 m)   Wt 189 lb 9.6 oz (86 kg)   SpO2 99%   BMI 28.00 kg/m     Wt Readings from Last 3 Encounters:  11/16/20 189 lb 9.6 oz (86 kg)  05/15/20 196 lb 6.4 oz (89.1 kg)  04/17/20 196 lb (88.9 kg)     GEN:  Well nourished, well developed in no acute distress HEENT: Normal NECK: No JVD LYMPHATICS: No lymphadenopathy CARDIAC: RRR, no murmurs, rubs, gallops RESPIRATORY:  Clear to auscultation without rales, wheezing or rhonchi  ABDOMEN: Soft, non-tender, non-distended MUSCULOSKELETAL:  No edema; No deformity  SKIN: Warm  and dry NEUROLOGIC:  Alert and oriented x 3 PSYCHIATRIC:  Normal affect   ASSESSMENT:    1. SVT (supraventricular tachycardia) (HCC)   2. Essential hypertension    PLAN:     SVT: presented with palpitations, Zio patch January 2021 showed 2 episodes of SVT, longest lasting 2.5 minutes.  TTE in 2016 showed no structural abnormalities.  No clear triggers.  Appears controlled on metoprolol. -On metoprolol 25 mg BID.  States that she often has issues remembering her evening dose, will consolidate to Toprol-XL 50 mg daily  Hypertension: On metoprolol, will consolidate to Toprol-XL 50 mg daily as above  RTC in 6 months  Medication Adjustments/Labs and Tests Ordered: Current medicines are reviewed at length with the patient today.  Concerns regarding medicines are outlined above.  No orders of the defined types were placed in this encounter.  Meds ordered this encounter  Medications  . metoprolol succinate (TOPROL-XL) 50 MG 24 hr tablet    Sig: Take 1 tablet (50 mg total) by mouth daily. Take with or immediately following a meal.    Dispense:  90 tablet    Refill:  3    Stop lopressor    Patient Instructions  Medication Instructions:  STOP metoprolol tartrate (Lopressor) START  metoprolol succinate (Toprol XL) 50 mg once daily  *If you need a refill on your cardiac medications before your next appointment, please call your pharmacy*  Follow-Up: At [facility], you and your health needs are our priority.  As part of our continuing mission to provide you with exceptional heart care, we have created designated Provider Care Teams.  These Care Teams include your primary Cardiologist (physician) and Advanced Practice Providers (APPs -  Physician Assistants and Nurse Practitioners) who all work together to provide you with the care you need, when you need it.  We recommend signing up for the patient portal called "MyChart".  Sign up information is provided on this After Visit Summary.  MyChart is used to connect with patients for Virtual Visits (Telemedicine).  Patients are able to view lab/test results, encounter notes, upcoming appointments, etc.  Non-urgent messages can be sent to your provider as well.   To learn more about what you can do with MyChart, go to [email or web address].    Your next appointment:   12 month(s)  The format for your next appointment:   In Person  Provider:   Epifanio Lesches, MD        Signed, Little Ishikawa, MD  11/17/2020 12:11 AM    [facility]

## 2020-11-19 NOTE — Addendum Note (Signed)
Addended by: Myna Hidalgo A on: 11/19/2020 02:22 PM   Modules accepted: Orders

## 2021-11-19 ENCOUNTER — Other Ambulatory Visit: Payer: Self-pay | Admitting: Family Medicine

## 2021-11-19 ENCOUNTER — Ambulatory Visit
Admission: RE | Admit: 2021-11-19 | Discharge: 2021-11-19 | Disposition: A | Discharge status: Home / Self Care | Payer: No Typology Code available for payment source | Source: Ambulatory Visit | Attending: Family Medicine | Admitting: Family Medicine

## 2021-11-19 DIAGNOSIS — M25559 Pain in unspecified hip: Secondary | ICD-10-CM

## 2021-11-20 ENCOUNTER — Other Ambulatory Visit: Payer: Self-pay | Admitting: Cardiology

## 2022-08-09 ENCOUNTER — Telehealth: Payer: Self-pay | Admitting: *Deleted

## 2022-08-09 NOTE — Telephone Encounter (Signed)
   Pre-operative Risk Assessment    Patient Name: Jessica Wilkins  DOB: [birth year] MRN: [id number]      Request for Surgical Clearance    Procedure:   LEFT THA  Date of Surgery:  Clearance TBD                                 Surgeon:  DR. Samson Frederic Surgeon's Group or Practice Name:  [facility] Phone number:  [phone] ATTN: KERRI MAZE Fax number:  [phone]   Type of Clearance Requested:   - Medical ; NO MEDICATIONS LISTED AS NEEDING TO BE HELD   Type of Anesthesia:  Spinal   Additional requests/questions:    Elpidio Anis   08/09/2022, 2:15 PM

## 2022-08-10 NOTE — Telephone Encounter (Signed)
   Name: HAANI BAKULA  DOB: 1981-02-06  MRN: [id number]  Primary Cardiologist: Little Ishikawa, MD  Chart reviewed as part of pre-operative protocol coverage. Because of Marybel L Scharrer's past medical history and time since last visit in 2021, she will require a follow-up in-office visit in order to better assess preoperative cardiovascular risk.  Pre-op covering staff: - Please schedule appointment and call patient to inform them. If patient already had an upcoming appointment within acceptable timeframe, please add "pre-op clearance" to the appointment notes so provider is aware. - Please contact requesting surgeon's office via preferred method (i.e, phone, fax) to inform them of need for appointment prior to surgery.    Tereso Newcomer, PA-C  08/10/2022, 8:22 AM

## 2022-08-10 NOTE — Telephone Encounter (Signed)
1st attempt to reach pt regarding surgical clearance and the need for an in-office appointment.  Left a message for pt to call back and get that scheduled.  

## 2022-08-11 NOTE — Telephone Encounter (Signed)
Left message to call back for IN OFFICE appt for pre op clearance. Appt can be with Dr. Bjorn Pippin or APP.

## 2022-08-12 NOTE — Telephone Encounter (Signed)
Pt has ain office appt with Edd Fabian, FNP 09/01/22 for pre op clearance.

## 2022-08-30 NOTE — Progress Notes (Unsigned)
Cardiology Clinic Note   Patient Name: Jessica Wilkins Date of Encounter: 09/01/2022  Primary Care Provider:  [facility] Cardiologist:  Little Ishikawa, MD  Patient Profile    Jessica Wilkins. [place] 41 year old female presents today for evaluation of her hypertension and preoperative cardiac evaluation.  Past Medical History    Past Medical History:  Diagnosis Date   Hypertension    Past Surgical History:  Procedure Laterality Date   CESAREAN SECTION     CESAREAN SECTION MULTI-GESTATIONAL N/A 01/07/2016   Procedure: CESAREAN SECTION MULTI-GESTATIONAL;  Surgeon: Candice Camp, MD;  Location: [facility];  Service: Obstetrics;  Laterality: N/A;    Allergies  Allergies  Allergen Reactions   Amoxicillin Hives   Cephalexin Hives   Penicillins Hives    Has patient had a PCN reaction causing immediate rash, facial/tongue/throat swelling, SOB or lightheadedness with hypotension: No Has patient had a PCN reaction causing severe rash involving mucus membranes or skin necrosis: No Has patient had a PCN reaction that required hospitalization No Has patient had a PCN reaction occurring within the last 10 years: No If all of the above answers are "NO", then may proceed with Cephalosporin use.     History of Present Illness    Ms. Siefken has a PMH of palpitations, and hypertension.  She was previously referred by Danella Penton, PA-C for evaluation of her palpitations.  She was previously followed by Dr. Delton See and was last seen in 2017.  She was noted to have persistent tachycardia during her pregnancy and was started on labetalol at that time.  Her symptoms improved and her labetalol was discontinued after delivery.  She later reported that postdelivery she noticed episodes of palpitations/tachycardia a couple times a year.  When she presented for follow-up evaluation with Dr. Bjorn Pippin she was noticing more frequent episodes of palpitations that  would occur once per week.  She indicated that during these episodes she felt like her heart would race.  It would last around 2 minutes typically but could last for up to an hour.  She denied lightheadedness or syncope.  She did not indicate any clear triggers.  There was no relationship with increased physical activity.  She indicated that she was physically active doing yoga and walking.  She denied exertional chest pain and dyspnea.  Her TSH was 1.9 on November 19, 2019.  A cardiac monitor 01/05/2020 showed 2 episodes of SVT, with the longest lasting 2.5 minutes and a maximum heart rate of 231.  She was started on metoprolol 25 mg twice daily.   On follow-up she noted an improvement with her palpitations after starting metoprolol.  She stated that she had only one episode of palpitations since initiating the medication.  With the episode she took a second dose of metoprolol and her palpitations resolved.  She also reported that she was drinking 2 Cokes zeros per day and would also drink Monster energy drinks a few times per week.  She would consume 3-4 alcoholic beverages per week.  She denied a relationship with caffeine or alcohol in her palpitations.   She contacted triage line on 04/07/2020 indicating that she had been having higher than usual blood pressures.  She indicated that she had received her second COVID-19 vaccination 1 week prior.   She presented to the clinic 04/17/20 for further evaluation and stated she had noticed increased blood pressures since 04/07/2020.  She was not routinely checking her blood pressures until a week  after her COVID-19 second injection when she felt some fatigue.  She brought her log in  that showed blood pressure was in the 150s over mid 80s.  She had no other cardiac complaints at that time.  No increased stress, no increased caffeine, no changes in diet.  I  gave her the salty 6 sheet, secondary causes of hypertension, had her increase her physical activity and gave her  a blood pressure log.  I planned follow-up for 1 month    She presented to the clinic 05/15/2020 for follow-up evaluation and stated she had increased her physical activity and was doing walking workouts 3-4 times per week for 20 to 30 minutes.  She was also trying to reduce the amount of sodium in her diet.  She stated that her blood pressures had continued to be somewhat elevated.  We checked her blood pressure cuff with the blood pressure cuff and the office.  Her blood pressure cuff was reading 30 points-40 points higher systolic than the calibrated office blood pressure cuff.  I had no concern for high blood pressure at the time.  I continued her metoprolol tartrate 25 mg twice daily, had her continue to follow low-sodium diet salty 6 sheet, and planned her follow-up in 6 months.  She was seen by Dr. Bjorn Pippin on 11/16/2020.  During that time she continued to do well.  Her palpitations were well controlled.  They would occur rarely.  She indicated she would notice them once every few months.  She denied chest pain, dyspnea, lightheadedness, lower extremity swelling.  She presents to the clinic today for follow-up evaluation and preoperative cardiac evaluation.  She states she continues to do well.  She had 1 episode of palpitations over the last 5 to 6 months.  The episode was random.  It lasted for a few minutes and dissipated with rest and deep breathing.  She has noticed that when she bends over without bending her knees she can feel palpitations starting.  She feels that her palpitations are well controlled with her metoprolol.  We will refill her medication.  We reviewed triggers for palpitations.  She expressed understanding.  We reviewed her upcoming surgery.  I will order a BMP and CBC.  We will have her follow-up in 12 months.   Today she denies chest pain, shortness of breath, lower extremity edema, fatigue, palpitations, melena, hematuria, hemoptysis, diaphoresis, weakness, presyncope, syncope,  orthopnea, and PND.  Home Medications    Prior to Admission medications   Medication Sig Start Date End Date Taking? Authorizing Provider  ALPRAZolam Prudy Feeler) 1 MG tablet Take 1 mg by mouth at bedtime as needed for anxiety.    [provider]  levonorgestrel-ethinyl estradiol (SEASONALE) 0.15-0.03 MG tablet Take 1 tablet by mouth daily.    [provider]  metoprolol succinate (TOPROL-XL) 50 MG 24 hr tablet TAKE 1 TABLET BY MOUTH DAILY. TAKE WITH OR IMMEDIATELY FOLLOWING A MEAL. 11/22/21   Little Ishikawa, MD    Family History    Family History  Problem Relation Age of Onset   Stroke Paternal Grandmother    Hypertension Paternal Grandmother    Heart attack Neg Hx    She indicated that her mother is alive. She indicated that her father is alive. She indicated that the status of her paternal grandmother is unknown. She indicated that the status of her neg hx is unknown.  Social History    Social History   Socioeconomic History   Marital status: Married  Spouse name: Not on file   Number of children: Not on file   Years of education: Not on file   Highest education level: Not on file  Occupational History   Not on file  Tobacco Use   Smoking status: Never   Smokeless tobacco: Never  Substance and Sexual Activity   Alcohol use: No   Drug use: No   Sexual activity: Yes    Birth control/protection: None  Other Topics Concern   Not on file  Social History Narrative   Not on file   Social Determinants of Health   Financial Resource Strain: Not on file  Food Insecurity: Not on file  Transportation Needs: Not on file  Physical Activity: Not on file  Stress: Not on file  Social Connections: Not on file  Intimate Partner Violence: Not on file     Review of Systems    General:  No chills, fever, night sweats or weight changes.  Cardiovascular:  No chest pain, dyspnea on exertion, edema, orthopnea, palpitations, paroxysmal nocturnal  dyspnea. Dermatological: No rash, lesions/masses Respiratory: No cough, dyspnea Urologic: No hematuria, dysuria Abdominal:   No nausea, vomiting, diarrhea, bright red blood per rectum, melena, or hematemesis Neurologic:  No visual changes, wkns, changes in mental status. All other systems reviewed and are otherwise negative except as noted above.  Physical Exam    VS:  BP 120/88   Pulse 80   Ht 5\' 9"  (1.753 m)   Wt 186 lb 9.6 oz (84.6 kg)   SpO2 98%   BMI 27.56 kg/m  , BMI Body mass index is 27.56 kg/m. GEN: Well nourished, well developed, in no acute distress. HEENT: normal. Neck: Supple, no JVD, carotid bruits, or masses. Cardiac: RRR, no murmurs, rubs, or gallops. No clubbing, cyanosis, edema.  Radials/DP/PT 2+ and equal bilaterally.  Respiratory:  Respirations regular and unlabored, clear to auscultation bilaterally. GI: Soft, nontender, nondistended, BS + x 4. MS: no deformity or atrophy. Skin: warm and dry, no rash. Neuro:  Strength and sensation are intact. Psych: Normal affect.  Accessory Clinical Findings    Recent Labs: No results found for requested labs within last 365 days.   Recent Lipid Panel No results found for: "CHOL", "TRIG", "HDL", "CHOLHDL", "VLDL", "LDLCALC", "LDLDIRECT"       ECG personally reviewed by me today-normal sinus rhythm no ectopy 80 bpm- No acute changes  Echocardiogram 09/28/2015 Study Conclusions   - Left ventricle: The cavity size was normal. Systolic function was    normal. The estimated ejection fraction was in the range of 60%    to 65%. Wall motion was normal; there were no regional wall    motion abnormalities.  - Aortic valve: Transvalvular velocity was within the normal range.    There was no stenosis. There was no regurgitation.  - Mitral valve: Transvalvular velocity was within the normal range.    There was no evidence for stenosis. There was no regurgitation.  - Right ventricle: Systolic function was normal.  -  Atrial septum: No defect or patent foramen ovale was identified.  - Inferior vena cava: The vessel was normal in size. The    respirophasic diameter changes were in the normal range (>= 50%),    consistent with normal central venous pressure.  Assessment & Plan   1.   Palpitations, SVT-EKG today shows normal sinus rhythm 80 bpm.  Denies recent episodes of irregular or accelerated heartbeat.  Cardiac event monitor 1/21 showed 2 episodes of SVT lasting longer  than 2.5 minutes Continue metoprolol Avoid triggers caffeine, chocolate, EtOH etc. Heart healthy low-sodium diet Increase physical activity as tolerated  Essential hypertension-BP today 120/88.  Contacted nurse triage line on 04/07/2020 indicating that her blood pressures have been elevated. Continue metoprolol  Heart healthy low-sodium diet. increase physical activity as tolerated    Preoperative cardiac evaluation-left total hip arthroplasty, TBD, Dr. Rod Can, [facility], fax [phone]   Chart reviewed as part of pre-operative protocol coverage. Given past medical history and time since last visit, based on ACC/AHA guidelines, Larayah L Golphin would be at acceptable risk for the planned procedure without further cardiovascular testing.   Patient was advised that if she develops new symptoms prior to surgery to contact our office to arrange a follow-up appointment.  He verbalized understanding.   Her RCRI is a class 1 risk, 0.4% risk of major cardiac event..  She is able to complete greater than 4 METS of physical activity.     Disposition: Follow-up with Dr. Gardiner Rhyme in 12 months.   Jossie Ng. Terrah Decoster NP-C     09/01/2022, 3:51 PM [facility] [place] [address] Office [phone] Fax [phone]  Notice: This dictation was prepared with Dragon dictation along with smaller phrase technology. Any transcriptional errors that result from this process are unintentional and may  not be corrected upon review.  I spent 14 minutes examining this patient, reviewing medications, and using patient centered shared decision making involving her cardiac care.  Prior to her visit I spent greater than 20 minutes reviewing her past medical history,  medications, and prior cardiac tests.

## 2022-09-01 ENCOUNTER — Ambulatory Visit: Payer: No Typology Code available for payment source | Attending: General Practice | Admitting: General Practice

## 2022-09-01 ENCOUNTER — Encounter: Payer: Self-pay | Admitting: General Practice

## 2022-09-01 VITALS — BP 120/88 | HR 80 | Ht 69.0 in | Wt 186.6 lb

## 2022-09-01 DIAGNOSIS — I10 Essential (primary) hypertension: Secondary | ICD-10-CM | POA: Diagnosis not present

## 2022-09-01 DIAGNOSIS — Z01810 Encounter for preprocedural cardiovascular examination: Secondary | ICD-10-CM | POA: Diagnosis not present

## 2022-09-01 DIAGNOSIS — I471 Supraventricular tachycardia: Secondary | ICD-10-CM | POA: Diagnosis not present

## 2022-09-01 NOTE — Patient Instructions (Addendum)
Medication Instructions:  Your physician recommends that you continue on your current medications as directed. Please refer to the Current Medication list given to you today.  *If you need a refill on your cardiac medications before your next appointment, please call your pharmacy*  Lab Work: Your physician recommends that you return for lab work TODAY:  BMP CBC  If you have labs (blood work) drawn today and your tests are completely normal, you will receive your results only by: [place] (if you have MyChart) OR A paper copy in the mail If you have any lab test that is abnormal or we need to change your treatment, we will call you to review the results.  Testing/Procedures: NONE ordered at this time of appointment   Follow-Up: At [facility], you and your health needs are our priority.  As part of our continuing mission to provide you with exceptional heart care, we have created designated Provider Care Teams.  These Care Teams include your primary Cardiologist (physician) and Advanced Practice Providers (APPs -  Physician Assistants and Nurse Practitioners) who all work together to provide you with the care you need, when you need it.  We recommend signing up for the patient portal called "MyChart".  Sign up information is provided on this After Visit Summary.  MyChart is used to connect with patients for Virtual Visits (Telemedicine).  Patients are able to view lab/test results, encounter notes, upcoming appointments, etc.  Non-urgent messages can be sent to your provider as well.   To learn more about what you can do with MyChart, go to [email or web address].    Your next appointment:   1 year(s)  The format for your next appointment:   In Person  Provider:   Donato Heinz, MD     Other Instructions When bending over bend knees   Important Information About Sugar

## 2022-09-02 LAB — CBC
Hematocrit: 39.7 % (ref 34.0–46.6)
Hemoglobin: 13.5 g/dL (ref 11.1–15.9)
MCH: 32.2 pg (ref 26.6–33.0)
MCHC: 34 g/dL (ref 31.5–35.7)
MCV: 95 fL (ref 79–97)
Platelets: 270 10*3/uL (ref 150–450)
RBC: 4.19 x10E6/uL (ref 3.77–5.28)
RDW: 12.1 % (ref 11.7–15.4)
WBC: 6.3 10*3/uL (ref 3.4–10.8)

## 2022-09-02 LAB — BASIC METABOLIC PANEL
BUN/Creatinine Ratio: 14 (ref 9–23)
BUN: 11 mg/dL (ref 6–24)
CO2: 21 mmol/L (ref 20–29)
Calcium: 9.4 mg/dL (ref 8.7–10.2)
Chloride: 102 mmol/L (ref 96–106)
Creatinine, Ser: 0.8 mg/dL (ref 0.57–1.00)
Glucose: 99 mg/dL (ref 70–99)
Potassium: 4.5 mmol/L (ref 3.5–5.2)
Sodium: 138 mmol/L (ref 134–144)
eGFR: 95 mL/min/{1.73_m2} (ref >59)

## 2022-12-04 ENCOUNTER — Other Ambulatory Visit: Payer: Self-pay | Admitting: Cardiology

## 2022-12-08 NOTE — Telephone Encounter (Signed)
Spoke to patient, patient is NOT currently pregnant or lactating.  Reports having tubes tied.      Rx sent to pharmacy

## 2022-12-08 NOTE — Telephone Encounter (Signed)
How have her palpitations been?  We could try stopping the metoprolol while pregnant and she how she does.  Would recommend follow-up in cardio-OB clinic while pregnant

## 2023-09-27 NOTE — Progress Notes (Unsigned)
Office Visit    Patient Name: Jessica Wilkins Date of Encounter: 09/28/2023  Primary Care Provider:  [facility] Cardiologist:  Little Ishikawa, MD  Chief Complaint    42 year old female with a history of palpitations, PSVT, and hypertension who presents for follow-up related to palpitations and hypertension.  Past Medical History    Past Medical History:  Diagnosis Date   Hypertension    Past Surgical History:  Procedure Laterality Date   CESAREAN SECTION     CESAREAN SECTION MULTI-GESTATIONAL N/A 01/07/2016   Procedure: CESAREAN SECTION MULTI-GESTATIONAL;  Surgeon: Candice Camp, MD;  Location: [facility];  Service: Obstetrics;  Laterality: N/A;    Allergies  Allergies  Allergen Reactions   Amoxicillin Hives   Cephalexin Hives   Penicillins Hives    Has patient had a PCN reaction causing immediate rash, facial/tongue/throat swelling, SOB or lightheadedness with hypotension: No Has patient had a PCN reaction causing severe rash involving mucus membranes or skin necrosis: No Has patient had a PCN reaction that required hospitalization No Has patient had a PCN reaction occurring within the last 10 years: No If all of the above answers are "NO", then may proceed with Cephalosporin use.      Labs/Other Studies Reviewed    The following studies were reviewed today:  Cardiac Studies & Procedures       ECHOCARDIOGRAM  ECHOCARDIOGRAM COMPLETE 09/28/2015  Narrative *[facility]* [address] [place], [place] [address] [phone]  ------------------------------------------------------------------- Transthoracic Echocardiography  Patient:    Jessica, Wilkins MR #:       [id number] Study Date: 09/28/2015 Gender:     F Age:        33 Height:     172.7 cm Weight:     97.5 kg BSA:        2.2 m^2 Pt. Status: Room:  SONOGRAPHER  Aida Raider, RDCS ORDERING     Tobias Alexander, M.D. REFERRING    Tobias Alexander, M.D. PERFORMING   [facility], Outpatient ATTENDING    Chilton Si, MD  cc:  ------------------------------------------------------------------- LV EF: 60% -   65%  ------------------------------------------------------------------- Indications:      R00.2 Palpitations.  ------------------------------------------------------------------- History:   PMH:  Acquired from the patient and from the patient&'s chart.  Risk factors:  [redacted] weeks pregnant.  ------------------------------------------------------------------- Study Conclusions  - Left ventricle: The cavity size was normal. Systolic function was normal. The estimated ejection fraction was in the range of 60% to 65%. Wall motion was normal; there were no regional wall motion abnormalities. - Aortic valve: Transvalvular velocity was within the normal range. There was no stenosis. There was no regurgitation. - Mitral valve: Transvalvular velocity was within the normal range. There was no evidence for stenosis. There was no regurgitation. - Right ventricle: Systolic function was normal. - Atrial septum: No defect or patent foramen ovale was identified. - Inferior vena cava: The vessel was normal in size. The respirophasic diameter changes were in the normal range (>= 50%), consistent with normal central venous pressure.  Transthoracic echocardiography.  M-mode, complete 2D, spectral Doppler, and color Doppler.  Birthdate:  Patient birthdate: 1981-10-18.  Age:  Patient is 42 yr old.  Sex:  Gender: female. BMI: 32.7 kg/m^2.  Blood pressure:     110/68  Patient status: Outpatient.  Study date:  Study date: 09/28/2015. Study time: 01:46 PM.  Location:  [facility]  -------------------------------------------------------------------  ------------------------------------------------------------------- Left ventricle:  The cavity size  was normal. Systolic function was normal. The estimated ejection fraction was in the  range of 60% to 65%. Wall motion was normal; there were no regional wall motion abnormalities.  ------------------------------------------------------------------- Aortic valve:   Trileaflet; normal thickness leaflets. Mobility was not restricted.  Doppler:  Transvalvular velocity was within the normal range. There was no stenosis. There was no regurgitation.  ------------------------------------------------------------------- Aorta:  Aortic root: The aortic root was normal in size.  ------------------------------------------------------------------- Mitral valve:   Structurally normal valve.   Mobility was not restricted.  Doppler:  Transvalvular velocity was within the normal range. There was no evidence for stenosis. There was no regurgitation.    Peak gradient (D): 3 mm Hg.  ------------------------------------------------------------------- Left atrium:  The atrium was normal in size.  ------------------------------------------------------------------- Atrial septum:  No defect or patent foramen ovale was identified.  ------------------------------------------------------------------- Right ventricle:  The cavity size was normal. Wall thickness was normal. Systolic function was normal.  ------------------------------------------------------------------- Pulmonic valve:    The valve appears to be grossly normal. Doppler:  Transvalvular velocity was within the normal range. There was no evidence for stenosis.  ------------------------------------------------------------------- Tricuspid valve:   Structurally normal valve.    Doppler: Transvalvular velocity was within the normal range. There was trivial regurgitation.  ------------------------------------------------------------------- Pulmonary artery:   The main pulmonary artery was normal-sized. Systolic pressure was within the normal range.  ------------------------------------------------------------------- Right  atrium:  The atrium was normal in size.  ------------------------------------------------------------------- Pericardium:  There was no pericardial effusion.  ------------------------------------------------------------------- Systemic veins: Inferior vena cava: The vessel was normal in size. The respirophasic diameter changes were in the normal range (>= 50%), consistent with normal central venous pressure.  ------------------------------------------------------------------- Measurements  Left ventricle                           Value        Reference LV ID, ED, PLAX chordal                  50.7  mm     43 - 52 LV ID, ES, PLAX chordal                  33.2  mm     23 - 38 LV fx shortening, PLAX chordal           35    %      >=29 LV PW thickness, ED                      8.3   mm     --------- IVS/LV PW ratio, ED                      0.9          <=1.3 Stroke volume, 2D                        83    ml     --------- Stroke volume/bsa, 2D                    38    ml/m^2 --------- LV e&', lateral                           14    cm/s   --------- LV E/e&', lateral  6.31         --------- LV e&', medial                            8.44  cm/s   --------- LV E/e&', medial                          10.47        --------- LV e&', average                           11.22 cm/s   --------- LV E/e&', average                         7.88         ---------  Ventricular septum                       Value        Reference IVS thickness, ED                        7.43  mm     ---------  LVOT                                     Value        Reference LVOT ID, S                               22    mm     --------- LVOT area                                3.8   cm^2   --------- LVOT peak velocity, S                    99.6  cm/s   --------- LVOT mean velocity, S                    76    cm/s   --------- LVOT VTI, S                              21.9  cm     ---------  Aorta                                     Value        Reference Aortic root ID, ED                       30    mm     ---------  Left atrium                              Value        Reference LA ID, A-P, ES  38    mm     --------- LA ID/bsa, A-P                           1.73  cm/m^2 <=2.2 LA volume, S                             48    ml     --------- LA volume/bsa, S                         21.9  ml/m^2 --------- LA volume, ES, 1-p A4C                   43    ml     --------- LA volume/bsa, ES, 1-p A4C               19.6  ml/m^2 --------- LA volume, ES, 1-p A2C                   50    ml     --------- LA volume/bsa, ES, 1-p A2C               22.8  ml/m^2 ---------  Mitral valve                             Value        Reference Mitral E-wave peak velocity              88.4  cm/s   --------- Mitral A-wave peak velocity              75    cm/s   --------- Mitral deceleration time                 180   ms     150 - 230 Mitral peak gradient, D                  3     mm Hg  --------- Mitral E/A ratio, peak                   1.2          ---------  Right ventricle                          Value        Reference RV s&', lateral, S                        17.5  cm/s   ---------  Legend: (L)  and  (H)  mark values outside specified reference range.  ------------------------------------------------------------------- Prepared and Electronically Authenticated by  Chilton Si, MD 2016-10-17T16:38:11    MONITORS  LONG TERM MONITOR (3-14 DAYS) 12/24/2019  Narrative  Two episodes of SVT, longest lasting 2 minutes 37 seconds with max rate 231 bpm  5 days of data recorded on Zio monitor. Patient had a min HR of 60 bpm, max HR of 231 bpm, and avg HR of 93 bpm. Predominant underlying rhythm was Sinus Rhythm. No VT,  atrial fibrillation, high degree block, or pauses noted. Two episodes of SVT, longest lasting 2 minutes 37 seconds with max rate 231 bpm.  Isolated atrial and  ventricular ectopy  was rare (<1%). There were 3 triggered events, 2 of which corresponded to sinus rhythm but 1 corresponded to SVT and possible accelerated junctional rhythm.          Recent Labs: No results found for requested labs within last 365 days.  Recent Lipid Panel No results found for: "CHOL", "TRIG", "HDL", "CHOLHDL", "VLDL", "LDLCALC", "LDLDIRECT"  History of Present Illness    42 year old female with the above past medical history including palpitations, PSVT, and hypertension.  Previously followed by Dr. Delton See, originally referred for the evaluation of palpitations.  She had persistent sinus tachycardia during pregnancy and was started on labetalol.  Her symptoms improved, labetalol was subsequently discontinued.  Echocardiogram in 2016 was normal. Cardiac monitor in 12/2019 showed 2 episodes of SVT, longest lasting 2.5 minutes, max heart rate of 231 bpm. She was started on metoprolol 25 mg twice daily.  She was last seen in the office on 09/01/2022 and was stable from a cardiac standpoint.  She noted rare palpitations.  She presents today for follow-up.  Since her last visit has been stable overall from a cardiac standpoint though she does note over the past month she has had increased episodes of racing heart beat.  She has had 3 episodes lasting approximately 2 to 5 hours at a time.  She tried vagal maneuvers, took extra metoprolol without improvement.  She is interested in speaking with our electrophysiology team regarding possible ablation.  Other than her recent palpitations, she denies any additional concerns today.    Home Medications    Current Outpatient Medications  Medication Sig Dispense Refill   ALPRAZolam (XANAX) 1 MG tablet Take 1 mg by mouth at bedtime as needed for anxiety.     Chlorpheniramine Maleate (ALLERGY 4 HOUR PO)      Cyanocobalamin (B-12) 50 MCG TABS      levonorgestrel-ethinyl estradiol (SEASONALE) 0.15-0.03 MG tablet Take 1 tablet by mouth daily.      metoprolol succinate (TOPROL XL) 25 MG 24 hr tablet Take 1 tablet (25 mg total) by mouth daily. 90 tablet 3   metoprolol succinate (TOPROL-XL) 50 MG 24 hr tablet TAKE 1 TABLET BY MOUTH EVERY DAY WITH OR IMMEDIATELY FOLLOWING A MEAL 90 tablet 3   meloxicam (MOBIC) 15 MG tablet Take 15 mg by mouth daily. (Patient not taking: Reported on 09/28/2023)     No current facility-administered medications for this visit.     Review of Systems    She denies chest pain, dyspnea, pnd, orthopnea, n, v, dizziness, syncope, edema, weight gain, or early satiety. All other systems reviewed and are otherwise negative except as noted above.   Physical Exam    VS:  BP 128/78 (BP Location: Left Arm, Patient Position: Sitting, Cuff Size: Normal)   Pulse 90   Ht 5\' 9"  (1.753 m)   Wt 204 lb 9.6 oz (92.8 kg)   SpO2 95%   BMI 30.21 kg/m   GEN: Well nourished, well developed, in no acute distress. HEENT: normal. Neck: Supple, no JVD, carotid bruits, or masses. Cardiac: RRR, no murmurs, rubs, or gallops. No clubbing, cyanosis, edema.  Radials/DP/PT 2+ and equal bilaterally.  Respiratory:  Respirations regular and unlabored, clear to auscultation bilaterally. GI: Soft, nontender, nondistended, BS + x 4. MS: no deformity or atrophy. Skin: warm and dry, no rash. Neuro:  Strength and sensation are intact. Psych: Normal affect.  Accessory Clinical Findings    ECG personally reviewed by me today - EKG Interpretation Date/Time:  Thursday September 28 2023  08:21:45 EDT Ventricular Rate:  90 PR Interval:  142 QRS Duration:  76 QT Interval:  354 QTC Calculation: 433 R Axis:   62  Text Interpretation: Normal sinus rhythm Normal ECG Confirmed by Bernadene Person ([id number]) on 09/28/2023 8:24:09 AM  - no acute changes.   Lab Results  Component Value Date   WBC 6.3 09/01/2022   HGB 13.5 09/01/2022   HCT 39.7 09/01/2022   MCV 95 09/01/2022   PLT 270 09/01/2022   Lab Results  Component Value Date   CREATININE 0.80  09/01/2022   BUN 11 09/01/2022   NA 138 09/01/2022   K 4.5 09/01/2022   CL 102 09/01/2022   CO2 21 09/01/2022   Lab Results  Component Value Date   ALT 50 01/13/2016   AST 43 (H) 01/13/2016   ALKPHOS 118 01/13/2016   BILITOT 0.8 01/13/2016   No results found for: "CHOL", "HDL", "LDLCALC", "LDLDIRECT", "TRIG", "CHOLHDL"  No results found for: "HGBA1C"  Assessment & Plan   1. Palpitations/PSVT: Cardiac monitor in 12/2019 showed 2 episodes of SVT, longest lasting 2.5 minutes, max heart rate of 231 bpm.  She notes a recent increase in palpitations, with episodes of racing heart beat lasting anywhere from 2 to 5 hours at a time.  She notes she feels that she cannot concentrate when these episodes occur.  She tried vagal movers, taking an additional dose of metoprolol, without improvement.  She is asking about the possibility of an ablation.  Will refer to EP.  In the meantime, will increase metoprolol to 75 mg daily.  Discussed home monitoring with Kardia mobile device.  Reviewed ED precautions. Will check BMET, TSH, magnesium.  Continue metoprolol as above.    2. Hypertension: BP well controlled. Continue current antihypertensive regimen.   3. Disposition: Follow-upFollow-up in 4 to 6 months with Dr. Bjorn Pippin.     Joylene Grapes, NP 09/28/2023, 8:49 AM

## 2023-09-28 ENCOUNTER — Encounter: Payer: Self-pay | Admitting: Nurse Practitioner

## 2023-09-28 ENCOUNTER — Ambulatory Visit: Payer: No Typology Code available for payment source | Attending: General Practice | Admitting: Nurse Practitioner

## 2023-09-28 VITALS — BP 128/78 | HR 90 | Ht 69.0 in | Wt 204.6 lb

## 2023-09-28 DIAGNOSIS — I10 Essential (primary) hypertension: Secondary | ICD-10-CM

## 2023-09-28 DIAGNOSIS — I471 Supraventricular tachycardia, unspecified: Secondary | ICD-10-CM | POA: Diagnosis not present

## 2023-09-28 DIAGNOSIS — R002 Palpitations: Secondary | ICD-10-CM

## 2023-09-28 MED ORDER — METOPROLOL SUCCINATE ER 25 MG PO TB24: 25.0000 mg | ORAL_TABLET | Freq: Every day | ORAL | 3 refills | Status: DC

## 2023-09-28 NOTE — Patient Instructions (Signed)
Medication Instructions:  Increase Metoprolol 75 mg daily.  *If you need a refill on your cardiac medications before your next appointment, please call your pharmacy*   Lab Work: BMET, TSH, Magnesium today   Testing/Procedures: NONE ordered at this time of appointment     Follow-Up: At [facility], you and your health needs are our priority.  As part of our continuing mission to provide you with exceptional heart care, we have created designated Provider Care Teams.  These Care Teams include your primary Cardiologist (physician) and Advanced Practice Providers (APPs -  Physician Assistants and Nurse Practitioners) who all work together to provide you with the care you need, when you need it.  We recommend signing up for the patient portal called "MyChart".  Sign up information is provided on this After Visit Summary.  MyChart is used to connect with patients for Virtual Visits (Telemedicine).  Patients are able to view lab/test results, encounter notes, upcoming appointments, etc.  Non-urgent messages can be sent to your provider as well.   To learn more about what you can do with MyChart, go to [email or web address].    Your next appointment:   4-6 month(s)  Provider:   Little Ishikawa, MD

## 2023-09-29 LAB — BASIC METABOLIC PANEL
BUN/Creatinine Ratio: 13 (ref 9–23)
BUN: 9 mg/dL (ref 6–24)
CO2: 23 mmol/L (ref 20–29)
Calcium: 9 mg/dL (ref 8.7–10.2)
Chloride: 103 mmol/L (ref 96–106)
Creatinine, Ser: 0.72 mg/dL (ref 0.57–1.00)
Glucose: 92 mg/dL (ref 70–99)
Potassium: 4.8 mmol/L (ref 3.5–5.2)
Sodium: 138 mmol/L (ref 134–144)
eGFR: 107 mL/min/{1.73_m2} (ref >59)

## 2023-09-29 LAB — TSH: TSH: 1.46 u[IU]/mL (ref 0.450–4.500)

## 2023-09-29 LAB — MAGNESIUM: Magnesium: 2.1 mg/dL (ref 1.6–2.3)

## 2023-10-11 ENCOUNTER — Ambulatory Visit: Payer: No Typology Code available for payment source | Admitting: General Practice

## 2023-11-28 ENCOUNTER — Other Ambulatory Visit: Payer: Self-pay | Admitting: Cardiology

## 2024-08-27 ENCOUNTER — Other Ambulatory Visit: Payer: Self-pay | Admitting: Nurse Practitioner

## 2024-10-09 NOTE — Progress Notes (Unsigned)
 Cardiology Office Note:    Date:  10/10/2024   ID:  Jessica Wilkins, DOB 11-06-1981, MRN [id number]  PCP:  [facility] [place]  Cardiologist:  Lonni LITTIE Nanas, MD  Electrophysiologist:  None   Referring MD: [facility]*   Chief Complaint  Patient presents with   SVT    History of Present Illness:    Jessica Wilkins is a 43 y.o. female with a hx of hypertension who presents for follow-up.  She was referred by Harlene Dumas, PA-C for evaluation of palpitations, initially seen 11/16/2020.  She previously followed with Dr. Maranda, last seen in 2017.    Cardiac monitor 01/05/2020 showed 2 episodes of SVT, longest lasting 2.5 minutes with max heart rate 231.  She was started on metoprolol  25 mg twice daily.  Since last clinic visit, she reports has had several episodes of SVT.  States that on 9/30 she went into SVT, lasted 9 hours.  She did not go to the ED.  Had another episode on 8/13 lasting 6 hours and an episode in March lasting 8 hours.  Felt lightheaded during episodes, denies any syncope. Denies any chest pain, dyspnea, lower extremity edema.    Past Medical History:  Diagnosis Date   Hypertension     Past Surgical History:  Procedure Laterality Date   CESAREAN SECTION     CESAREAN SECTION MULTI-GESTATIONAL N/A 01/07/2016   Procedure: CESAREAN SECTION MULTI-GESTATIONAL;  Surgeon: Alm Cook, MD;  Location: [facility];  Service: Obstetrics;  Laterality: N/A;    Current Medications: Current Meds  Medication Sig   ALPRAZolam (XANAX) 1 MG tablet Take 1 mg by mouth at bedtime as needed for anxiety. (Patient taking differently: Take 2 mg by mouth at bedtime as needed for anxiety (may take 2 mg at night for anxiety and sleep).)   Chlorpheniramine Maleate (ALLERGY 4 HOUR PO)    levonorgestrel-ethinyl estradiol (SEASONALE) 0.15-0.03 MG tablet Take 1 tablet by mouth daily.   metoprolol  succinate (TOPROL -XL) 100 MG 24 hr tablet Take 1  tablet (100 mg total) by mouth daily. Take with or immediately following a meal.     Allergies:   Amoxicillin, Cephalexin, and Penicillins   Social History   Socioeconomic History   Marital status: Married    Spouse name: Not on file   Number of children: Not on file   Years of education: Not on file   Highest education level: Not on file  Occupational History   Not on file  Tobacco Use   Smoking status: Never   Smokeless tobacco: Never  Substance and Sexual Activity   Alcohol use: No   Drug use: No   Sexual activity: Yes    Birth control/protection: None  Other Topics Concern   Not on file  Social History Narrative   Not on file   Social Drivers of Health   Financial Resource Strain: Not on file  Food Insecurity: Not on file  Transportation Needs: Not on file  Physical Activity: Not on file  Stress: Not on file  Social Connections: Not on file     Family History: The patient's family history includes Hypertension in her paternal grandmother; Stroke in her paternal grandmother. There is no history of Heart attack.  ROS:   Please see the history of present illness.     All other systems reviewed and are negative.  EKGs/Labs/Other Studies Reviewed:    The following studies were reviewed today:   EKG:   10/10/24:  Normal sinus rhythm, rate 92, no ST abnormalities  Cardiac monitor 01/05/20: Two episodes of SVT, longest lasting 2 minutes 37 seconds with max rate 231 bpm   5 days of data recorded on Zio monitor. Patient had a min HR of 60 bpm, max HR of 231 bpm, and avg HR of 93 bpm. Predominant underlying rhythm was Sinus Rhythm. No VT,  atrial fibrillation, high degree block, or pauses noted. Two episodes of SVT, longest lasting 2 minutes 37 seconds with max rate 231 bpm.  Isolated atrial and ventricular ectopy was rare (<1%). There were 3 triggered events, 2 of which corresponded to sinus rhythm but 1 corresponded to SVT and possible accelerated junctional rhythm.     Holter monitor 09/28/15: Sinus rhythm to sinus tachycardia for 40 % of the monitoring time.     TTE 09/28/15: - Left ventricle: The cavity size was normal. Systolic function was   normal. The estimated ejection fraction was in the range of 60%   to 65%. Wall motion was normal; there were no regional wall   motion abnormalities. - Aortic valve: Transvalvular velocity was within the normal range.   There was no stenosis. There was no regurgitation. - Mitral valve: Transvalvular velocity was within the normal range.   There was no evidence for stenosis. There was no regurgitation. - Right ventricle: Systolic function was normal. - Atrial septum: No defect or patent foramen ovale was identified. - Inferior vena cava: The vessel was normal in size. The   respirophasic diameter changes were in the normal range (>= 50%),   consistent with normal central venous pressure.  Recent Labs: No results found for requested labs within last 365 days.  Recent Lipid Panel No results found for: CHOL, TRIG, HDL, CHOLHDL, VLDL, LDLCALC, LDLDIRECT  Physical Exam:    VS:  BP (!) 143/102 (BP Location: Left Arm, Patient Position: Sitting, Cuff Size: Normal)   Pulse 96   Ht 5' 9 (1.753 m)   Wt 214 lb 3.2 oz (97.2 kg)   SpO2 97%   BMI 31.63 kg/m     Wt Readings from Last 3 Encounters:  10/10/24 214 lb 3.2 oz (97.2 kg)  09/28/23 204 lb 9.6 oz (92.8 kg)  09/01/22 186 lb 9.6 oz (84.6 kg)     GEN:  Well nourished, well developed in no acute distress HEENT: Normal NECK: No JVD LYMPHATICS: No lymphadenopathy CARDIAC: RRR, no murmurs, rubs, gallops RESPIRATORY:  Clear to auscultation without rales, wheezing or rhonchi  ABDOMEN: Soft, non-tender, non-distended MUSCULOSKELETAL:  No edema; No deformity  SKIN: Warm and dry NEUROLOGIC:  Alert and oriented x 3 PSYCHIATRIC:  Normal affect   ASSESSMENT:    1. SVT (supraventricular tachycardia)   2. Essential hypertension     PLAN:     SVT: presented with palpitations, Zio patch January 2021 showed 2 episodes of SVT, longest lasting 2.5 minutes.  TTE in 2016 showed no structural abnormalities.  No clear triggers.  Had previously been controlled on metoprolol  but now reporting multiple episodes over the last year lasting up to 9 hours. - Increase Toprol -XL to 100 mg daily - Referral to EP to evaluate for ablation  Hypertension: BP elevated, increasing Toprol -XL to 100 mg daily as above  RTC in 6 months  Medication Adjustments/Labs and Tests Ordered: Current medicines are reviewed at length with the patient today.  Concerns regarding medicines are outlined above.  Orders Placed This Encounter  Procedures   Ambulatory referral to Cardiac Electrophysiology   EKG 12-Lead  Meds ordered this encounter  Medications   metoprolol  succinate (TOPROL -XL) 100 MG 24 hr tablet    Sig: Take 1 tablet (100 mg total) by mouth daily. Take with or immediately following a meal.    Dispense:  90 tablet    Refill:  3    Patient Instructions  Medication Instructions:  Increase Toprol  XL to 100mg  daily *If you need a refill on your cardiac medications before your next appointment, please call your pharmacy*  Lab Work: None ordered If you have labs (blood work) drawn today and your tests are completely normal, you will receive your results only by: MyChart Message (if you have MyChart) OR A paper copy in the mail If you have any lab test that is abnormal or we need to change your treatment, we will call you to review the results.  Testing/Procedures: None ordered  Follow-Up: At [facility], you and your health needs are our priority.  As part of our continuing mission to provide you with exceptional heart care, our providers are all part of one team.  This team includes your primary Cardiologist (physician) and Advanced Practice Providers or APPs (Physician Assistants and Nurse Practitioners) who all work together to  provide you with the care you need, when you need it.  Your next appointment:   6 month(s)  Provider:   Lonni LITTIE Nanas, MD    We recommend signing up for the patient portal called MyChart.  Sign up information is provided on this After Visit Summary.  MyChart is used to connect with patients for Virtual Visits (Telemedicine).  Patients are able to view lab/test results, encounter notes, upcoming appointments, etc.  Non-urgent messages can be sent to your provider as well.   To learn more about what you can do with MyChart, go to [email or web address].      Signed, Lonni LITTIE Nanas, MD  10/10/2024 9:55 AM    [facility]

## 2024-10-10 ENCOUNTER — Ambulatory Visit: Attending: Internal Medicine | Admitting: Cardiology

## 2024-10-10 VITALS — BP 143/102 | HR 96 | Ht 69.0 in | Wt 214.2 lb

## 2024-10-10 DIAGNOSIS — I471 Supraventricular tachycardia, unspecified: Secondary | ICD-10-CM | POA: Diagnosis not present

## 2024-10-10 DIAGNOSIS — I10 Essential (primary) hypertension: Secondary | ICD-10-CM

## 2024-10-10 MED ORDER — METOPROLOL SUCCINATE ER 100 MG PO TB24: 100.0000 mg | ORAL_TABLET | Freq: Every day | ORAL | 3 refills | Status: AC

## 2024-10-10 NOTE — Patient Instructions (Signed)
 Medication Instructions:  Increase Toprol  XL to 100mg  daily *If you need a refill on your cardiac medications before your next appointment, please call your pharmacy*  Lab Work: None ordered If you have labs (blood work) drawn today and your tests are completely normal, you will receive your results only by: MyChart Message (if you have MyChart) OR A paper copy in the mail If you have any lab test that is abnormal or we need to change your treatment, we will call you to review the results.  Testing/Procedures: None ordered  Follow-Up: At [facility], you and your health needs are our priority.  As part of our continuing mission to provide you with exceptional heart care, our providers are all part of one team.  This team includes your primary Cardiologist (physician) and Advanced Practice Providers or APPs (Physician Assistants and Nurse Practitioners) who all work together to provide you with the care you need, when you need it.  Your next appointment:   6 month(s)  Provider:   Lonni LITTIE Nanas, MD    We recommend signing up for the patient portal called MyChart.  Sign up information is provided on this After Visit Summary.  MyChart is used to connect with patients for Virtual Visits (Telemedicine).  Patients are able to view lab/test results, encounter notes, upcoming appointments, etc.  Non-urgent messages can be sent to your provider as well.   To learn more about what you can do with MyChart, go to [email or web address].

## 2024-11-19 NOTE — Progress Notes (Unsigned)
  Electrophysiology Office Note:   Date:  11/19/2024  ID:  Jessica Wilkins, DOB 1981-08-14, MRN [id number]  Primary Cardiologist: Lonni LITTIE Nanas, MD Primary Heart Failure: None Electrophysiologist: None  {Click to update primary MD,subspecialty MD or APP then REFRESH:1}    History of Present Illness:   Jessica Wilkins is a 43 y.o. female with h/o hypertension, SVT seen today for  for Electrophysiology evaluation of SVT at the request of Lonni Mas.    She wore a cardiac monitor 01/05/2020 that showed 2 episodes of SVT, the longest 2-1/2 minutes with a heart rate of 231 bpm.  She has been on metoprolol  since then.  She unfortunately has had more frequent episodes of SVT.  She has had episodes of 9, 8, and 13 hours.  Discussed the use of AI scribe software for clinical note transcription with the patient, who gave verbal consent to proceed.  History of Present Illness     Review of systems complete and found to be negative unless listed in HPI.   EP Information / Studies Reviewed:    {EKGtoday:28818}        Risk Assessment/Calculations:            Physical Exam:   VS:  There were no vitals taken for this visit.   Wt Readings from Last 3 Encounters:  10/10/24 214 lb 3.2 oz (97.2 kg)  09/28/23 204 lb 9.6 oz (92.8 kg)  09/01/22 186 lb 9.6 oz (84.6 kg)     GEN: Well nourished, well developed in no acute distress NECK: No JVD; No carotid bruits CARDIAC: {EPRHYTHM:28826}, no murmurs, rubs, gallops RESPIRATORY:  Clear to auscultation without rales, wheezing or rhonchi  ABDOMEN: Soft, non-tender, non-distended EXTREMITIES:  No edema; No deformity   ASSESSMENT AND PLAN:    1.  SVT: Has had an increased burden in her SVT episodes.  On Toprol -XL.***  2.  Hypertension:***  Follow up with {EPMDS:28135::EP Team} {EPFOLLOW LE:71826}  Signed, Karne Ozga Gladis Norton, MD

## 2024-11-20 ENCOUNTER — Ambulatory Visit: Attending: Cardiology | Admitting: Cardiology

## 2024-11-20 ENCOUNTER — Encounter: Payer: Self-pay | Admitting: Cardiology

## 2024-11-20 VITALS — BP 141/98 | HR 93 | Ht 69.0 in | Wt 215.0 lb

## 2024-11-20 DIAGNOSIS — Z01812 Encounter for preprocedural laboratory examination: Secondary | ICD-10-CM | POA: Diagnosis not present

## 2024-11-20 DIAGNOSIS — I471 Supraventricular tachycardia, unspecified: Secondary | ICD-10-CM | POA: Diagnosis not present

## 2024-11-20 DIAGNOSIS — I10 Essential (primary) hypertension: Secondary | ICD-10-CM | POA: Diagnosis not present

## 2024-11-20 NOTE — Patient Instructions (Signed)
 Medication Instructions:  Your physician recommends that you continue on your current medications as directed. Please refer to the Current Medication list given to you today.  *If you need a refill on your cardiac medications before your next appointment, please call your pharmacy*  Lab Work: None ordered  If you have any lab test that is abnormal or we need to change your treatment, we will call you to review the results.  Testing/Procedures: Your physician has recommended that you have an ablation. Catheter ablation is a medical procedure used to treat some cardiac arrhythmias (irregular heartbeats). During catheter ablation, a long, thin, flexible tube is put into a blood vessel in your groin (upper thigh), or neck. This tube is called an ablation catheter. It is then guided to your heart through the blood vessel. Radio frequency waves destroy small areas of heart tissue where abnormal heartbeats may cause an arrhythmia to start. Please see the instruction sheet given to you today.   You will be scheduled for 01/17/2025, arrive to [facility] @ 1:30 pm  Follow-Up: At [facility], you and your health needs are our priority.  As part of our continuing mission to provide you with exceptional heart care, our providers are all part of one team.  This team includes your primary Cardiologist (physician) and Advanced Practice Providers or APPs (Physician Assistants and Nurse Practitioners) who all work together to provide you with the care you need, when you need it.  Your next appointment:   1 month(s)  Provider:   You will see one of the following Advanced Practice Providers on your designated Care Team:   Charlies Arthur, PA-C Michael Andy Tillery, PA-C Suzann Riddle, NP Daphne Barrack, NP Artist Pouch, PA-C    Thank you for choosing [facility]!!   Maeola Domino, RN [phone]   Other Instructions  Cardiac Ablation Cardiac ablation is a procedure to destroy  (ablate) heart tissue that is sending bad signals. These bad signals cause the heart to beat very fast or in a way that is not normal. Destroying some tissues can help make the heart rhythm normal. Tell your doctor about: Any allergies you have. All medicines you are taking. These include vitamins, herbs, eye drops, creams, and over-the-counter medicines. Any problems you or family members have had with anesthesia. Any bleeding problems you have. Any surgeries you have had. Any medical conditions you have. Whether you are pregnant or may be pregnant. What are the risks? Your doctor will talk with you about risks. These may include: Infection. Bruising and bleeding. Stroke or blood clots. Damage to nearby areas of your body. Allergies to medicines or dyes. Needing a pacemaker if the heart gets damaged. A pacemaker helps the heart beat normally. The procedure not working. What happens before the procedure? Medicines Ask your doctor about changing or stopping: Your normal medicines. Vitamins, herbs, and supplements. Over-the-counter medicines. Do not take aspirin or ibuprofen  unless you are told to. General instructions Follow instructions from your doctor about what you may eat and drink. If you will be going home right after the procedure, plan to have a responsible adult: Take you home from the hospital or clinic. You will not be allowed to drive. Care for you for the time you are told. Ask your doctor what steps will be taken to prevent the spread of germs. What happens during the procedure?  An IV tube will be put into one of your veins. You may be given: A sedative. This helps  you relax. Anesthesia. This will: Numb certain areas of your body. The skin on your neck or groin will be numbed. A cut (incision) will be made in your neck or groin. A needle will be put through the cut and into a large vein. The small, thin tube (catheter) will be put into the needle. The tube will  be moved to your heart. A type of X-ray (fluoroscopy) will be used to help guide the tube. It will also show constant images of the heart on a screen. Dye may be put through the tube. This helps your doctor see your heart. An electric current will be sent from the tube to destroy heart tissue in certain areas. The tube will be taken out. Pressure will be held on your cut. This helps stop bleeding. A bandage (dressing) will be put over your cut. The procedure may vary among doctors and hospitals. What happens after the procedure? You will be monitored until you leave the hospital or clinic. This includes checking your blood pressure, heart rate and rhythm, breathing rate, and blood oxygen level. Your cut will be checked for bleeding. You will need to lie still for a few hours. If your groin was used, you will need to keep your leg straight for a few hours after the small, thin tube is removed. This information is not intended to replace advice given to you by your health care provider. Make sure you discuss any questions you have with your health care provider. Document Revised: 05/17/2022 Document Reviewed: 05/17/2022 Elsevier Patient Education  2024 [employer].

## 2024-12-17 ENCOUNTER — Telehealth: Payer: Self-pay

## 2024-12-17 NOTE — Telephone Encounter (Signed)
-----   Message from Nurse Doreatha BROCKS, RN sent at 11/28/2024  8:37 AM EST ----- Regarding: 2/6 SVT ablation  Precert:  MD: Camnitz Type of ablation: SVT Diagnosis: SVT CPT code: SVT/WPW (06346) Ablation scheduled (date/time): 2/6 at 130pm  Procedure:  Added to calendar? Yes Orders entered? Yes Letter complete? No, >30 days before procedure Scheduled with cath lab? Yes Any medications to hold? Yes (please list hold instructions): metoprolol  for 4 days Labs ordered (CBC, BMET, PT/INR if on warfarin): Yes Mapping system: Doesn't matter CARTO/OPAL rep notified? No Cardiac CT needed? No Dye allergy? No Pre-meds ordered and instructions given? No, not needed Letter method: MyChart H&P: 12/10 Device: No  Follow-up:  Cassie/Angel, please schedule Routine.  Covering RN - please send this message to [employer], EP scheduler, EP Scheduling pool, EP [employer], and CT scheduler (Brittany Lynch/Stephanie Mogg), if indicated.

## 2024-12-25 ENCOUNTER — Telehealth (HOSPITAL_COMMUNITY): Payer: Self-pay

## 2024-12-25 ENCOUNTER — Encounter (HOSPITAL_COMMUNITY): Payer: Self-pay

## 2024-12-25 NOTE — Telephone Encounter (Signed)
 Attempted to reach patient to discuss upcoming procedure, no answer. Unable to leave message- VM full.

## 2025-01-14 NOTE — Telephone Encounter (Signed)
 Attempted to reach patient to discuss upcoming procedure, no answer. DPR on file- left detailed VM for patient with the following information.   Arrival time- 11:30 AM on 01/17/25 to [facility] admitting Nothing to eat or drink after midnight No meds AM of procedure  Last dose of Metoprolol  on 01/12/25 Responsible person to drive you home and stay with you for 24 hours Contact for any changes in health status or start of any new medications Required lab work to be completed today at any [facility] location.  Contact information for Nurse Navigator provided.

## 2025-01-15 ENCOUNTER — Telehealth: Payer: Self-pay | Admitting: Cardiology

## 2025-01-15 NOTE — Telephone Encounter (Signed)
 Pt notified of scheduling change at the hospital. Aware procedure has been rescheduled -- arrive to [facility] at 9:30 am, procedure 2 hours later.  Patient verbalized understanding and agreeable to plan.

## 2025-01-15 NOTE — Telephone Encounter (Signed)
 Patient was calling for instructions for her procedure on 2/6 please advise

## 2025-01-15 NOTE — Telephone Encounter (Signed)
 Made pt aware of instructions being sent, via mychart, on several occasions.  Aware we have attempted to reach her. Verbally reviewed instructions.  Also had pt pull up her instructions on mychart. Made aware she will need to get blood work today, she will stop by a [facility] to complete this.  Forwarding to nurse navigator, [place], for her RICK and she has been attempting to reach pt.

## 2025-01-15 NOTE — Telephone Encounter (Signed)
 Forgot to add -- pt reports stopping Metoprolol  yesterday, not Sunday as instructions advise. Will forward to MD to approve proceeding with procedure (pt informed that I do not believe we will have to reschedule procedure, but will await MD answer)

## 2025-01-16 LAB — BASIC METABOLIC PANEL WITH GFR
BUN/Creatinine Ratio: 17 (ref 9–23)
BUN: 13 mg/dL (ref 6–24)
CO2: 20 mmol/L (ref 20–29)
Calcium: 9.3 mg/dL (ref 8.7–10.2)
Chloride: 103 mmol/L (ref 96–106)
Creatinine, Ser: 0.75 mg/dL (ref 0.57–1.00)
Glucose: 87 mg/dL (ref 70–99)
Potassium: 4.2 mmol/L (ref 3.5–5.2)
Sodium: 139 mmol/L (ref 134–144)
eGFR: 101 mL/min/{1.73_m2}

## 2025-01-16 LAB — CBC
Hematocrit: 41.3 % (ref 34.0–46.6)
Hemoglobin: 14.2 g/dL (ref 11.1–15.9)
MCH: 33.3 pg — ABNORMAL HIGH (ref 26.6–33.0)
MCHC: 34.4 g/dL (ref 31.5–35.7)
MCV: 97 fL (ref 79–97)
Platelets: 267 10*3/uL (ref 150–450)
RBC: 4.27 x10E6/uL (ref 3.77–5.28)
RDW: 11.9 % (ref 11.7–15.4)
WBC: 5.2 10*3/uL (ref 3.4–10.8)

## 2025-01-16 NOTE — Anesthesia Preprocedure Evaluation (Signed)
"                                    Anesthesia Evaluation  Patient identified by MRN, date of birth, ID band Patient awake    Reviewed: Allergy & Precautions, NPO status , Patient's Chart, lab work & pertinent test results, reviewed documented beta blocker date and time   History of Anesthesia Complications Negative for: history of anesthetic complications  Airway Mallampati: III  TM Distance: >3 FB Neck ROM: Full    Dental no notable dental hx. (+) Teeth Intact   Pulmonary neg pulmonary ROS, neg sleep apnea, neg COPD, Patient abstained from smoking.Not current smoker   Pulmonary exam normal breath sounds clear to auscultation       Cardiovascular Exercise Tolerance: Good METShypertension, Pt. on home beta blockers and Pt. on medications (-) CAD and (-) Past MI + dysrhythmias Supra Ventricular Tachycardia  Rhythm:Regular Rate:Normal - Systolic murmurs    Neuro/Psych negative neurological ROS  negative psych ROS   GI/Hepatic negative GI ROS, Neg liver ROS,neg GERD  ,,  Endo/Other  negative endocrine ROSneg diabetes    Renal/GU negative Renal ROS     Musculoskeletal negative musculoskeletal ROS (+)    Abdominal  (+) + obese  Peds  Hematology negative hematology ROS (+)   Anesthesia Other Findings Past Medical History: No date: Hypertension  Reproductive/Obstetrics                              Anesthesia Physical Anesthesia Plan  ASA: 3  Anesthesia Plan: MAC   Post-op Pain Management: Tylenol  PO (pre-op)*   Induction: Intravenous  PONV Risk Score and Plan: 2 and Treatment may vary due to age or medical condition, Ondansetron , Midazolam , Propofol  infusion and Dexamethasone   Airway Management Planned: Natural Airway and Simple Face Mask  Additional Equipment: None  Intra-op Plan:   Post-operative Plan:   Informed Consent: I have reviewed the patients History and Physical, chart, labs and discussed the procedure  including the risks, benefits and alternatives for the proposed anesthesia with the patient or authorized representative who has indicated his/her understanding and acceptance.     Dental advisory given  Plan Discussed with: CRNA and Surgeon  Anesthesia Plan Comments: (Discussed risks of anesthesia with patient, including possibility of difficulty with spontaneous ventilation under anesthesia necessitating airway intervention, PONV, and rare risks such as cardiac or respiratory or neurological events, and allergic reactions. Discussed the role of CRNA in patient's perioperative care. Patient understands.)         Anesthesia Quick Evaluation  "

## 2025-01-16 NOTE — Pre-Procedure Instructions (Signed)
 Instructed patient on the following items: Arrival time 0930 Nothing to eat or drink after midnight No meds AM of procedure Responsible person to drive you home and stay with you for 24 hrs  Last Metoprolol  was 2/1

## 2025-01-17 ENCOUNTER — Other Ambulatory Visit: Payer: Self-pay

## 2025-01-17 ENCOUNTER — Ambulatory Visit (HOSPITAL_COMMUNITY)
Admission: RE | Admit: 2025-01-17 | Discharge: 2025-01-17 | Disposition: A | Source: Home / Self Care | Attending: Cardiology | Admitting: Cardiology

## 2025-01-17 ENCOUNTER — Encounter (HOSPITAL_COMMUNITY): Admission: RE | Disposition: A | Payer: Self-pay | Source: Home / Self Care | Attending: Cardiology

## 2025-01-17 ENCOUNTER — Encounter (HOSPITAL_COMMUNITY): Payer: Self-pay | Admitting: Anesthesiology

## 2025-01-17 MED ORDER — FENTANYL CITRATE (PF) 100 MCG/2ML IJ SOLN
INTRAMUSCULAR | Status: AC
  Filled 2025-01-17: qty 2

## 2025-01-17 MED ORDER — MIDAZOLAM HCL 2 MG/2ML IJ SOLN
INTRAMUSCULAR | Status: AC
  Filled 2025-01-17: qty 2

## 2025-01-17 MED ORDER — SODIUM CHLORIDE 0.9 % IV SOLN: INTRAVENOUS | Status: DC

## 2025-01-17 MED ORDER — PROPOFOL 500 MG/50ML IV EMUL
INTRAVENOUS | Status: DC | PRN
  Administered 2025-01-17: 75 ug/kg/min via INTRAVENOUS

## 2025-01-17 MED ORDER — SODIUM CHLORIDE 0.9% FLUSH: 3.0000 mL | Freq: Two times a day (BID) | INTRAVENOUS | Status: DC

## 2025-01-17 MED ORDER — ACETAMINOPHEN 325 MG PO TABS: 650.0000 mg | ORAL_TABLET | ORAL | Status: DC | PRN

## 2025-01-17 MED ORDER — LIDOCAINE HCL 1 % IJ SOLN
INTRAMUSCULAR | Status: AC
  Filled 2025-01-17: qty 40

## 2025-01-17 MED ORDER — SODIUM CHLORIDE 0.9 % IV SOLN: 250.0000 mL | INTRAVENOUS | Status: DC | PRN

## 2025-01-17 MED ORDER — MIDAZOLAM HCL (PF) 2 MG/2ML IJ SOLN
INTRAMUSCULAR | Status: DC | PRN
  Administered 2025-01-17 (×2): 2 mg via INTRAVENOUS

## 2025-01-17 MED ORDER — HEPARIN (PORCINE) IN NACL 1000-0.9 UT/500ML-% IV SOLN
INTRAVENOUS | Status: DC | PRN
  Administered 2025-01-17: 500 mL

## 2025-01-17 MED ORDER — FENTANYL CITRATE (PF) 100 MCG/2ML IJ SOLN
INTRAMUSCULAR | Status: DC | PRN
  Administered 2025-01-17 (×2): 100 ug via INTRAVENOUS

## 2025-01-17 MED ORDER — LIDOCAINE HCL 1 % IJ SOLN
INTRAMUSCULAR | Status: AC
  Filled 2025-01-17: qty 60

## 2025-01-17 MED ORDER — LIDOCAINE HCL (PF) 1 % IJ SOLN
INTRAMUSCULAR | Status: DC | PRN
  Administered 2025-01-17: 60 mL

## 2025-01-17 MED ORDER — ACETAMINOPHEN 500 MG PO TABS
1000.0000 mg | ORAL_TABLET | Freq: Once | ORAL | Status: AC
  Administered 2025-01-17: 1000 mg via ORAL
  Filled 2025-01-17: qty 2

## 2025-01-17 MED ORDER — SODIUM CHLORIDE 0.9% FLUSH: 3.0000 mL | INTRAVENOUS | Status: DC | PRN

## 2025-01-17 MED ORDER — ONDANSETRON HCL 4 MG/2ML IJ SOLN: 4.0000 mg | Freq: Four times a day (QID) | INTRAMUSCULAR | Status: DC | PRN

## 2025-01-17 MED ORDER — PROPOFOL 10 MG/ML IV BOLUS
INTRAVENOUS | Status: DC | PRN
  Administered 2025-01-17: 50 mg via INTRAVENOUS

## 2025-01-17 NOTE — Transfer of Care (Signed)
 Immediate Anesthesia Transfer of Care Note  Patient: Jessica Wilkins  Procedure(s) Performed: SVT ABLATION  Patient Location: Cath Lab  Anesthesia Type:MAC  Level of Consciousness: awake, alert , and oriented  Airway & Oxygen Therapy: Patient Spontanous Breathing  Post-op Assessment: Report given to RN and Post -op Vital signs reviewed and stable  Post vital signs: Reviewed and stable  Last Vitals:  Vitals Value Taken Time  BP 135/92 01/17/25 12:53  Temp 36.7 C 01/17/25 12:50  Pulse 97 01/17/25 12:55  Resp 22 01/17/25 12:55  SpO2 97 % 01/17/25 12:55  Vitals shown include unfiled device data.  Last Pain:  Vitals:   01/17/25 1250  TempSrc: Oral  PainSc: 0-No pain         Complications: There were no known notable events for this encounter.

## 2025-01-17 NOTE — H&P (Signed)
" °  Electrophysiology Office Note:   Date:  01/17/2025  ID:  Jessica Wilkins, DOB 24-Sep-1981, MRN [id number]  Primary Cardiologist: Lonni LITTIE Nanas, MD Primary Heart Failure: None Electrophysiologist: Jacqulyn Barresi Gladis Norton, MD      History of Present Illness:   Jessica Wilkins is a 44 y.o. female with h/o hypertension, SVT seen today for  for Electrophysiology evaluation of SVT at the request of Lonni Mas.    She wore a cardiac monitor 01/05/2020 that showed 2 episodes of SVT, the longest 2-1/2 minutes with a heart rate of 231 bpm.  She has been on metoprolol  since then.  She unfortunately has had more frequent episodes of SVT.  She has had episodes of 9, 8, and 13 hours.  Today, denies symptoms of palpitations, chest pain, dyspnea, orthopnea, PND, lower extremity edema, claudication, dizziness, presyncope, syncope, bleeding, or neurologic sequela. The patient is tolerating medications without difficulties. Plan ablation today.   EP Information / Studies Reviewed:    EKG is not ordered today. EKG from 10/10/2024 reviewed which showed sinus rhythm        Risk Assessment/Calculations:            Physical Exam:   VS:  BP (!) 140/95   Pulse 83   Temp 99.1 F (37.3 C) (Oral)   Resp 18   Ht 5' 8 (1.727 m)   Wt 95.3 kg   SpO2 97%   BMI 31.93 kg/m    Wt Readings from Last 3 Encounters:  01/17/25 95.3 kg  11/20/24 97.5 kg  10/10/24 97.2 kg    GEN: Well nourished, well developed in no acute distress NECK: No JVD; No carotid bruits CARDIAC: Regular rate and rhythm, no murmurs, rubs, gallops RESPIRATORY:  Clear to auscultation without rales, wheezing or rhonchi  ABDOMEN: Soft, non-tender, non-distended EXTREMITIES:  No edema; No deformity    ASSESSMENT AND PLAN:    1.  SVT: Jessica Wilkins has presented today for surgery, with the diagnosis of SVT.  The various methods of treatment have been discussed with the patient and family. After consideration of risks,  benefits and other options for treatment, the patient has consented to  Procedure(s): Catheter ablation as a surgical intervention .  Risks include but not limited to complete heart block, stroke, esophageal damage, nerve damage, bleeding, vascular damage, tamponade, perforation, MI, and death. The patient's history has been reviewed, patient examined, no change in status, stable for surgery.  I have reviewed the patient's chart and labs.  Questions were answered to the patient's satisfaction.    Doneen Ollinger Norton, MD 01/17/2025 10:30 AM  "

## 2025-01-17 NOTE — Anesthesia Postprocedure Evaluation (Signed)
"   Anesthesia Post Note  Patient: Jessica Wilkins  Procedure(s) Performed: SVT ABLATION     Patient location during evaluation: PACU Anesthesia Type: MAC Level of consciousness: awake and alert Pain management: pain level controlled Vital Signs Assessment: post-procedure vital signs reviewed and stable Respiratory status: spontaneous breathing, nonlabored ventilation and respiratory function stable Cardiovascular status: blood pressure returned to baseline Postop Assessment: no apparent nausea or vomiting Anesthetic complications: no   There were no known notable events for this encounter.  Last Vitals:  Vitals:   01/17/25 1305 01/17/25 1310  BP: (!) 129/94 (!) 127/96  Pulse: 86 83  Resp: 17 15  Temp:    SpO2: 97% 98%    Last Pain:  Vitals:   01/17/25 1250  TempSrc: Oral  PainSc: 0-No pain   Pain Goal:                   Vertell Row      "

## 2025-01-17 NOTE — Discharge Instructions (Signed)

## 2025-02-18 ENCOUNTER — Ambulatory Visit: Admitting: Cardiology
